# Patient Record
Sex: Male | Born: 1965
Health system: Southern US, Community
[De-identification: ages and names within clinical notes are randomized; demographics above are authoritative.]

## PROBLEM LIST (undated history)

## (undated) DIAGNOSIS — J309 Allergic rhinitis, unspecified: Principal | ICD-10-CM

## (undated) DIAGNOSIS — H1010 Acute atopic conjunctivitis, unspecified eye: Secondary | ICD-10-CM

## (undated) DIAGNOSIS — G2581 Restless legs syndrome: Secondary | ICD-10-CM

## (undated) DIAGNOSIS — F419 Anxiety disorder, unspecified: Secondary | ICD-10-CM

## (undated) HISTORY — PX: ADENOIDECTOMY: SUR15

## (undated) HISTORY — DX: Allergic rhinitis, unspecified: J30.9

## (undated) HISTORY — DX: Restless legs syndrome: G25.81

## (undated) HISTORY — PX: MEDIAL PARTIAL KNEE REPLACEMENT: SHX5965

## (undated) HISTORY — DX: Allergic rhinitis, unspecified: H10.10

## (undated) HISTORY — PX: TONSILLECTOMY: SUR1361

## (undated) HISTORY — DX: Anxiety disorder, unspecified: F41.9

---

## 2000-05-04 ENCOUNTER — Encounter: Payer: Self-pay | Admitting: Cardiology

## 2000-05-04 ENCOUNTER — Encounter: Admission: RE | Admit: 2000-05-04 | Discharge: 2000-05-04 | Payer: Self-pay | Admitting: Cardiology

## 2000-05-06 ENCOUNTER — Ambulatory Visit (HOSPITAL_COMMUNITY): Admission: RE | Admit: 2000-05-06 | Discharge: 2000-05-06 | Payer: Self-pay | Admitting: Cardiology

## 2000-11-09 ENCOUNTER — Ambulatory Visit: Admission: RE | Admit: 2000-11-09 | Discharge: 2000-11-09 | Payer: Self-pay | Admitting: Family Medicine

## 2000-12-09 ENCOUNTER — Ambulatory Visit (HOSPITAL_BASED_OUTPATIENT_CLINIC_OR_DEPARTMENT_OTHER): Admission: RE | Admit: 2000-12-09 | Discharge: 2000-12-09 | Payer: Self-pay | Admitting: Family Medicine

## 2001-01-21 ENCOUNTER — Ambulatory Visit (HOSPITAL_BASED_OUTPATIENT_CLINIC_OR_DEPARTMENT_OTHER): Admission: RE | Admit: 2001-01-21 | Discharge: 2001-01-21 | Payer: Self-pay | Admitting: Internal Medicine

## 2007-08-06 ENCOUNTER — Observation Stay (HOSPITAL_COMMUNITY): Admission: RE | Admit: 2007-08-06 | Discharge: 2007-08-07 | Payer: Self-pay | Admitting: Otolaryngology

## 2007-08-06 ENCOUNTER — Encounter (INDEPENDENT_AMBULATORY_CARE_PROVIDER_SITE_OTHER): Payer: Self-pay | Admitting: Otolaryngology

## 2010-03-20 ENCOUNTER — Encounter
Admission: RE | Admit: 2010-03-20 | Discharge: 2010-03-20 | Payer: Self-pay | Source: Home / Self Care | Attending: Family Medicine | Admitting: Family Medicine

## 2010-07-30 NOTE — Op Note (Signed)
[name], [name]               ACCOUNT NO.:  [id number]   MEDICAL RECORD NO.:  [id number]          PATIENT TYPE:  OBV   LOCATION:  3302                         FACILITY:  [facility]   PHYSICIAN:  Hermelinda Medicus, M.D.   DATE OF BIRTH:  1965-10-29   DATE OF PROCEDURE:  DATE OF DISCHARGE:                               OPERATIVE REPORT   POSTOPERATIVE DIAGNOSIS:  Sleep apnea with a lowest O2 of 85%, a  respiratory disturbance index of 34.5, and a rapid eye movement and deep  sleep of no time spent.   POSTOPERATIVE DIAGNOSIS:  Sleep apnea with a lowest O2 of 85%, a  respiratory disturbance index of 34.5, and a rapid eye movement and deep  sleep of no time spent.   OPERATION:  1. Septal reconstruction.  2. Turbinate reduction.  3. Tonsillectomy.  4. Uvulopalatoplasty.   ANESTHESIA:  General endotracheal anesthesia with Dr. Krista Blue.   SURGEON:  Hermelinda Medicus, MD   INDICATIONS FOR PROCEDURE:  The patient is aware of the risks and gains.  He is aware he could have some bleeding from his tonsillar beds and  needs to be very careful.  He should not travel long distances for at  least 10 days.  He is to be on a soft diet.  He has no travel into out-  of-the-way areas, and he is also to be on an antibiotic postoperatively  and is given pain medication to use judiciously.   PROCEDURE IN DETAIL:  The patient was placed in the supine position, and  under general endotracheal anesthesia, the nose was first approached,  where 1% Xylocaine with epinephrine and topical cocaine was used for the  anesthesia.  After prepping and draping in the usual manner, the septum  was approached.  Using a hemitransfixion incision at the ethmoid region,  this mucosal perichondrium and periosteum was elevated on the right  side, and a section of ethmoid septum was removed using the opened and  closed Jansen-Middleton.  Once this was completed, the septum on the  right side was in good status.  On the left side,  the vomerine septal  spur, we approached it in a posterior incision and removed this spur  that stood about a centimeter to slightly greater than that sticking  into the side of the nose posteriorly.  Once this was removed using a 4-  mm chisel and the open and closed Jansen-Middleton, the septum was in  good midline and closure was completed using 4-0 plain catgut through-  and-through septal suture.  The inferior turbinates were aggressively  outfractured, but no mucous membrane was removed, and then the Elmed  bipolar cautery was used to cauterize the lateral aspects of the  inferior turbinates.  The patient tolerated this very well and Telfa was  placed within his nose to minimize any bleeding or swelling.  The  patient was then repositioned.  The tonsillar gag was placed.  The  tonsils were removed using blunt and Bovie electrocoagulation  dissection.  All hemostasis was established.  This gave [place] considerable  more space in this very small shallow mouth.  Then, the palate was  trimmed using this dissection lateral to the uvula elevating the palatal  contour to approximately 7 mm, and the uvula, which was enlarged  primarily because of the persistent snoring, was trimmed down to its  normal size.  The anterior and posterior mucous membranes were then  approximated on the uvula using 5-0 plain catgut.  Once this was  achieved and all hemostasis was again checked, the stomach was  suctioned, the nasopharynx was suctioned, then the Telfa was removed,  and a 7 x 7-1/2 anesthesia trumpet were replaced within the nose to  guarantee his postoperative airway.  The patient was taken to the  recovery room in good condition with the plan of keeping him overnight  under observation on pulse oxymetry and then will be home on soft, bland  diet.   FOLLOWUP:  His followup will then be in 5 days, then 10 days, then 3  weeks, 6 weeks, 3 months, 6 months, and a year.            ______________________________  Hermelinda Medicus, M.D.     JC/MEDQ  D:  08/06/2007  T:  08/07/2007  Job:  [id number]   cc:   Molly Maduro A. Nicholos Johns, M.D.  Anna Genre Little, M.D.

## 2010-07-30 NOTE — H&P (Signed)
NAME:  [name], [name] NO.:  [id number]   MEDICAL RECORD NO.:  [id number]           PATIENT TYPE:   LOCATION:                                 FACILITY:   PHYSICIAN:  Hermelinda Medicus, M.D.        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  He is a snorer, but also he has sleep apnea.  He  had a sleep study completed where he had a respiratory disturbance index  of 34.6.  His lowest O2 was 85%, but his deep sleep and REM sleep was  absent, normal would be approximately 30%.  He is chronically fatigued.  He is falling asleep in front of TV or a book, and he just feels spent  and is on medication to try to help him get more rest.  Our plan,  because he has a very small nose, a septal deviation secondary to  history of trauma and a turbinate hypertrophy plus he has moderate-sized  tonsils and a very small mouth, is to do a septal reconstruction,  turbinate reduction, a tonsillectomy, and a uvulopalatoplasty to try to  gain some airway to improve his sleep pattern as well as improve the  sleep apnea issue.  He in 2002, had a cardiac cath, which was completely  clear by Dr. Julieanne Manson.  He has had no problem at any time since  that time and has had no chest pain and is an active individual except  for this chronic fatigue issue.  He has an allergy to DILAUDID.  His  medications are Cymbalta and ropinirole.  His respiratory status is  totally unremarkable.  He has had a chest x-ray in the distant past,  which was unremarkable.  He does not smoke, and he now enters for the  above-mentioned sleep surgery.  He has considered CPAP and has rejected  that.   PHYSICAL EXAMINATION:  VITAL SIGNS:  His physical examination reveals a  blood pressure of 127/80, his pulse is 78, temperature 97.8, and pulse  oximetry 98%.  His  lowest O2 when he has a polysomnogram was 85%.  He is  overweight.  His weight is 214.  His BMI is 29.85.  HEENT:  His larynx is clear.  True cords, false cords, epiglottis, base  of tongue are clear of any ulceration or mass.  True cord mobility, gag  reflex, tongue mobility, EOMs, and facial nerve were all symmetrical as  is shoulder strength.  His tonsils were moderately large in size taking  up considerable space in the small pharynx.  His septum is deviated to  the right in the ethmoid and then he has vomerine spur on the left  blocking the more posterior aspect of his nose.  He also has  considerable turbinate hypertrophy and has a fairly small narrow nose.  NECK:  His neck is free of any thyromegaly, cervical adenopathy, or  mass.  CHEST:  Clear.  No rales, rhonchi, or wheezes.  CARDIOVASCULAR:  Exam without rubs, murmurs, or gallops.  EXTREMITIES:  Unremarkable.   INITIAL DIAGNOSES:  Sleep apnea with respiratory distress index of 34.6,  lowest O2 85%, but no deep or rapid eye movement sleep was recorded,  history of cardiac cauterization with normal results, history of severe  sleepiness secondary to sleep deprivation.           ______________________________  Hermelinda Medicus, M.D.     JC/MEDQ  D:  08/06/2007  T:  08/06/2007  Job:  [id number]   cc:   Molly Maduro A. Nicholos Johns, M.D.  Thereasa Solo. Little, M.D.

## 2010-08-02 NOTE — Cardiovascular Report (Signed)
[place]. [facility]  Patient:    [name], [name]                      MRN: [id number] Proc. Date: 05/06/00 Adm. Date:  [id number] Attending:  Loreli Dollar CC:         Cath Lab  Meredith Staggers, M.D.   Cardiac Catheterization  INDICATIONS:  Mr. 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 He had a standard physical examination which included an exercise stress test. The stress test was positive showing inferior ST segment depression of 3 mm that persisted 4 minutes in the recovery phase. Because of this, he is brought in for cardiac catheterization. He has been asymptomatic except for a drop in his exercise tolerance.  PROCEDURES PERFORMED: 1. Left heart catheterization. 2. Selective right and left coronary arteriography. 3. Ventriculography in RAO projection.  COMPLICATIONS:  None.  EQUIPMENT: #6 [place] Judkins configuration catheters.  DESCRIPTION OF PROCEDURE:  The patient was prepped and draped in the usual sterile fashion exposing the right groin. Following local anesthetic of 1% Xylocaine, the Seldinger technique was employed and a #6 [place] introducer sheath was placed into the right femoral artery. Selective right and left coronary arteriography and ventriculography in the RAO projection was performed.  RESULTS:  HEMODYNAMIC MONITORING: 1. Central aortic pressure was 102/67. 2. Left ventricular pressure was 104/8 with no aortic valve gradient    noted at the time of pullback.  VENTRICULOGRAPHY:  Ventriculography in the RAO projection using 25 cc of contrast at 12 cc per second showed normal left ventricular systolic function, calculated at 63%. The end diastolic pressure was 16. There was mild mitral valve prolapse with no mitral regurgitation.  CORONARY ARTERIOGRAPHY:  There was no calcification noted on fluoroscopy. 1. Left main coronary artery:  Normal. 2. Left anterior descending  artery: The LAD extended down and across the apex    of the heart and gave rise to a first diagonal. This was relatively large    vessel with about 3.5 to 3.75 mm. This system was free of disease. 3. Optional diagonal:  Normal. 4. Circumflex coronary artery:  The circumflex gave rise to a single large    obtuse marginal vessel which was free of disease. 5. Right coronary artery:  The right coronary artery was a large dominant    vessel with a posterior descending artery and two posterolateral branches.    This system was free of disease.  CONCLUSIONS: 1. No evidence of coronary artery disease. 2. Normal left ventricular systolic function. 3. Mild mitral valve prolapse without mitral regurgitation.  COMMENTS:  At this point it appears that the stress test was a false positive study. I see nothing to suggest that there is any concerns that he has any coronary artery disease. DD:  05/06/00 TD:  05/06/00 Job: [id number]

## 2010-12-11 LAB — CBC
HCT: 50
Hemoglobin: 16.5
MCHC: 32.9
MCV: 77.5 — ABNORMAL LOW
Platelets: 185
RBC: 6.44 — ABNORMAL HIGH
RDW: 14.1
WBC: 5.8

## 2010-12-11 LAB — URINALYSIS, ROUTINE W REFLEX MICROSCOPIC
Bilirubin Urine: NEGATIVE
Glucose, UA: NEGATIVE
Hgb urine dipstick: NEGATIVE
Ketones, ur: NEGATIVE
Nitrite: NEGATIVE
Protein, ur: NEGATIVE
Specific Gravity, Urine: 1.009
Urobilinogen, UA: 0.2
pH: 6.5

## 2013-02-15 ENCOUNTER — Other Ambulatory Visit: Payer: Self-pay | Admitting: Otolaryngology

## 2013-02-15 DIAGNOSIS — H905 Unspecified sensorineural hearing loss: Secondary | ICD-10-CM

## 2013-02-15 DIAGNOSIS — H9312 Tinnitus, left ear: Secondary | ICD-10-CM

## 2013-02-23 ENCOUNTER — Ambulatory Visit
Admission: RE | Admit: 2013-02-23 | Discharge: 2013-02-23 | Disposition: A | Discharge status: Home / Self Care | Payer: 59 | Source: Ambulatory Visit | Attending: Otolaryngology | Admitting: Otolaryngology

## 2013-02-23 DIAGNOSIS — H9312 Tinnitus, left ear: Secondary | ICD-10-CM

## 2013-02-23 DIAGNOSIS — H905 Unspecified sensorineural hearing loss: Secondary | ICD-10-CM

## 2013-02-23 MED ORDER — GADOBENATE DIMEGLUMINE 529 MG/ML IV SOLN
17.0000 mL | Freq: Once | INTRAVENOUS | Status: AC | PRN
  Administered 2013-02-23: 17 mL via INTRAVENOUS

## 2014-01-03 ENCOUNTER — Other Ambulatory Visit: Payer: Self-pay | Admitting: Orthopedic Surgery

## 2014-01-03 DIAGNOSIS — M25562 Pain in left knee: Secondary | ICD-10-CM

## 2014-01-13 ENCOUNTER — Other Ambulatory Visit: Payer: 59

## 2014-01-18 ENCOUNTER — Ambulatory Visit
Admission: RE | Admit: 2014-01-18 | Discharge: 2014-01-18 | Disposition: A | Discharge status: Home / Self Care | Payer: 59 | Source: Ambulatory Visit | Attending: Orthopedic Surgery | Admitting: Orthopedic Surgery

## 2014-01-18 DIAGNOSIS — M25562 Pain in left knee: Secondary | ICD-10-CM

## 2014-01-18 MED ORDER — IOHEXOL 180 MG/ML  SOLN
30.0000 mL | Freq: Once | INTRAMUSCULAR | Status: AC | PRN
  Administered 2014-01-18: 30 mL via INTRA_ARTICULAR

## 2014-06-14 ENCOUNTER — Other Ambulatory Visit: Payer: Self-pay | Admitting: Orthopedic Surgery

## 2014-06-14 DIAGNOSIS — Z09 Encounter for follow-up examination after completed treatment for conditions other than malignant neoplasm: Secondary | ICD-10-CM

## 2014-06-18 ENCOUNTER — Other Ambulatory Visit: Payer: Self-pay

## 2014-06-19 ENCOUNTER — Other Ambulatory Visit: Payer: Self-pay

## 2014-06-21 ENCOUNTER — Ambulatory Visit
Admission: RE | Admit: 2014-06-21 | Discharge: 2014-06-21 | Disposition: A | Discharge status: Home / Self Care | Payer: 59 | Source: Ambulatory Visit | Attending: Orthopedic Surgery | Admitting: Orthopedic Surgery

## 2014-06-21 DIAGNOSIS — Z09 Encounter for follow-up examination after completed treatment for conditions other than malignant neoplasm: Secondary | ICD-10-CM

## 2016-06-12 DIAGNOSIS — L821 Other seborrheic keratosis: Secondary | ICD-10-CM | POA: Diagnosis not present

## 2016-06-12 DIAGNOSIS — L728 Other follicular cysts of the skin and subcutaneous tissue: Secondary | ICD-10-CM | POA: Diagnosis not present

## 2016-06-19 ENCOUNTER — Other Ambulatory Visit: Payer: Self-pay | Admitting: Nurse Practitioner

## 2016-06-19 ENCOUNTER — Ambulatory Visit
Admission: RE | Admit: 2016-06-19 | Discharge: 2016-06-19 | Disposition: A | Discharge status: Home / Self Care | Payer: 59 | Source: Ambulatory Visit | Attending: Nurse Practitioner | Admitting: Nurse Practitioner

## 2016-06-19 DIAGNOSIS — R053 Chronic cough: Secondary | ICD-10-CM

## 2016-06-19 DIAGNOSIS — R0989 Other specified symptoms and signs involving the circulatory and respiratory systems: Secondary | ICD-10-CM

## 2016-06-19 DIAGNOSIS — R05 Cough: Secondary | ICD-10-CM | POA: Diagnosis not present

## 2016-06-23 ENCOUNTER — Emergency Department (HOSPITAL_COMMUNITY)
Admission: EM | Admit: 2016-06-23 | Discharge: 2016-06-23 | Disposition: A | Discharge status: Home / Self Care | Payer: 59 | Attending: Emergency Medicine | Admitting: Emergency Medicine

## 2016-06-23 ENCOUNTER — Emergency Department (HOSPITAL_COMMUNITY): Payer: 59

## 2016-06-23 DIAGNOSIS — Z7982 Long term (current) use of aspirin: Secondary | ICD-10-CM | POA: Insufficient documentation

## 2016-06-23 DIAGNOSIS — J9811 Atelectasis: Secondary | ICD-10-CM | POA: Diagnosis not present

## 2016-06-23 DIAGNOSIS — R0789 Other chest pain: Secondary | ICD-10-CM | POA: Insufficient documentation

## 2016-06-23 DIAGNOSIS — R072 Precordial pain: Secondary | ICD-10-CM | POA: Diagnosis not present

## 2016-06-23 LAB — BASIC METABOLIC PANEL
ANION GAP: 12 (ref 5–15)
BUN: 17 mg/dL (ref 6–20)
CO2: 24 mmol/L (ref 22–32)
Calcium: 9.5 mg/dL (ref 8.9–10.3)
Chloride: 100 mmol/L — ABNORMAL LOW (ref 101–111)
Creatinine, Ser: 1.19 mg/dL (ref 0.61–1.24)
Glucose, Bld: 107 mg/dL — ABNORMAL HIGH (ref 65–99)
POTASSIUM: 3.9 mmol/L (ref 3.5–5.1)
SODIUM: 136 mmol/L (ref 135–145)

## 2016-06-23 LAB — CBC
HEMATOCRIT: 47.5 % (ref 39.0–52.0)
Hemoglobin: 15.8 g/dL (ref 13.0–17.0)
MCH: 25.6 pg — ABNORMAL LOW (ref 26.0–34.0)
MCHC: 33.3 g/dL (ref 30.0–36.0)
MCV: 77.1 fL — ABNORMAL LOW (ref 78.0–100.0)
Platelets: 165 10*3/uL (ref 150–400)
RBC: 6.16 MIL/uL — AB (ref 4.22–5.81)
RDW: 15 % (ref 11.5–15.5)
WBC: 6.7 10*3/uL (ref 4.0–10.5)

## 2016-06-23 LAB — TROPONIN I

## 2016-06-23 LAB — I-STAT TROPONIN, ED: Troponin i, poc: 0 ng/mL (ref 0.00–0.08)

## 2016-06-23 MED ORDER — ASPIRIN 81 MG PO CHEW
CHEWABLE_TABLET | ORAL | Status: AC
  Administered 2016-06-23: 324 mg
  Filled 2016-06-23: qty 4

## 2016-06-23 MED ORDER — KETOROLAC TROMETHAMINE 30 MG/ML IJ SOLN
15.0000 mg | Freq: Once | INTRAMUSCULAR | Status: DC
  Filled 2016-06-23: qty 1

## 2016-06-23 NOTE — ED Provider Notes (Signed)
[facility]ENCY DEPT Provider Note   CSN: [id number] Arrival date & time: 06/23/16  1504     History   Chief Complaint Chief Complaint  Patient presents with  . Chest Pain    HPI [name] is a 51 y.o. male.  HPI  Patient presents with concern of chest pain. He notes over the past few days he has had pain just to the left of the sternum. No pleurisy, no exertional, no positional changes. Pain is sore, moderate. Today the patient developed pain in the sternum itself, without radiation, otherwise characteristically similar to his earlier pain. No associated dyspnea, fever, chills, nausea, vomiting, cough. Patient is generally well, smokes cigars occasionally, drinks socially, has no known cardiac history.   No past medical history on file.  There are no active problems to display for this patient.   No past surgical history on file.     Home Medications    Prior to Admission medications   Not on File    Family History No family history on file.  Social History Social History  Substance Use Topics  . Smoking status: Not on file  . Smokeless tobacco: Not on file  . Alcohol use Not on file     Allergies   Patient has no known allergies.   Review of Systems Review of Systems  Constitutional:       Per HPI, otherwise negative  HENT:       Per HPI, otherwise negative  Respiratory:       Per HPI, otherwise negative  Cardiovascular:       Per HPI, otherwise negative  Gastrointestinal: Negative for vomiting.  Endocrine:       Negative aside from HPI  Genitourinary:       Neg aside from HPI   Musculoskeletal:       Per HPI, otherwise negative  Skin: Negative.   Neurological: Negative for syncope.     Physical Exam Updated Vital Signs BP (!) 156/92 (BP Location: Right Arm)   Pulse 87   Temp 99 F (37.2 C)   SpO2 99%   Physical Exam  Constitutional: He is oriented to person, place, and time. He appears well-developed. No distress.    HENT:  Head: Normocephalic and atraumatic.  Eyes: Conjunctivae and EOM are normal.  Cardiovascular: Normal rate and regular rhythm.   Pulmonary/Chest: Effort normal. No stridor. No respiratory distress. He exhibits no tenderness.  Abdominal: He exhibits no distension.  Musculoskeletal: He exhibits no edema.  Neurological: He is alert and oriented to person, place, and time.  Skin: Skin is warm and dry.  Psychiatric: He has a normal mood and affect.  Nursing note and vitals reviewed.    ED Treatments / Results  Labs (all labs ordered are listed, but only abnormal results are displayed) Labs Reviewed  BASIC METABOLIC PANEL - Abnormal; Notable for the following:       Result Value   Chloride 100 (*)    Glucose, Bld 107 (*)    All other components within normal limits  CBC - Abnormal; Notable for the following:    RBC 6.16 (*)    MCV 77.1 (*)    MCH 25.6 (*)    All other components within normal limits  TROPONIN I  I-STAT TROPOININ, ED    EKG  EKG Interpretation  Date/Time:  Monday June 23 2016 15:42:31 EDT Ventricular Rate:  88 PR Interval:    QRS Duration: 85 QT Interval:  370 QTC Calculation: 448  R Axis:   86 Text Interpretation:  Sinus rhythm Baseline wander in lead(s) V1 T wave abnormality Borderline ECG Confirmed by Gerhard Munch  MD [phone]) on 06/23/2016 3:59:00 PM       Radiology Dg Chest 2 View  Result Date: 06/23/2016 CLINICAL DATA:  Chest tightness for 2 weeks EXAM: CHEST  2 VIEW COMPARISON:  06/19/2016 FINDINGS: Cardiac shadow is within normal limits. The lungs are well aerated bilaterally. No focal infiltrate the is noted. Minimal left basilar atelectasis is seen projecting in the lingula. No acute bony abnormality is noted. IMPRESSION: Minimal left basilar atelectasis. Electronically Signed   By: Alcide Clever M.D.   On: 06/23/2016 16:20    Procedures Procedures (including critical care time)  Medications Ordered in ED Medications  ketorolac  (TORADOL) 30 MG/ML injection 15 mg (not administered)  aspirin 81 MG chewable tablet (324 mg  Given 06/23/16 1547)   4:43 PM Pain resolved.  Patient and family aware of initial findings.  6:56 PM Patient awake and alert in no distress I discussed all findings with him, his wife, and a work Animator. Specifically we discussed reassuring EKG, x-ray, serial negative troponin. I reviewed the x-ray, and there arechanges from prior, and he specifically denies any cough, fever, low suspicion for occult, atypical pneumonia. With no ongoing exertional, pleuritic pain, no chest pain currently, no evidence for infection, no evidence for pulmonary embolism, low risk profile, the patient is appropriate for further evaluation, management as an outpatient. Initial Impression / Assessment and Plan / ED Course  I have reviewed the triage vital signs and the nursing notes.  Pertinent labs & imaging results that were available during my care of the patient were reviewed by me and considered in my medical decision making (see chart for details).    Final Clinical Impressions(s) / ED Diagnoses  Atypical chest pain   Gerhard Munch, MD 06/23/16 1857

## 2016-06-23 NOTE — Discharge Instructions (Signed)
As discussed, your evaluation today has been largely reassuring.  But, it is important that you monitor your condition carefully, and do not hesitate to return to the ED if you develop new, or concerning changes in your condition. ? ?Otherwise, please follow-up with your physician for appropriate ongoing care. ? ?

## 2016-06-23 NOTE — ED Notes (Signed)
Ambulatory to restroom without any complains. Denies cp or sob. Skin w/d.

## 2016-06-23 NOTE — ED Notes (Signed)
Transported to xray 

## 2016-06-23 NOTE — ED Triage Notes (Signed)
Chest tightness for the past couple of weeks. Worse today. Pt was at work prior to coming to ED. Ambulatory into ED without difficulty. Pt states walking doesn't make pain worse or better. Describes pain as tightness 5/10 center of chest at present. No diaphoresis, no n/v

## 2016-06-23 NOTE — ED Notes (Signed)
Pt declines needing Toradol at this time.

## 2016-07-09 ENCOUNTER — Ambulatory Visit: Payer: 59 | Admitting: Physician Assistant

## 2016-07-23 DIAGNOSIS — K219 Gastro-esophageal reflux disease without esophagitis: Secondary | ICD-10-CM | POA: Diagnosis not present

## 2016-07-23 DIAGNOSIS — Z0000 Encounter for general adult medical examination without abnormal findings: Secondary | ICD-10-CM | POA: Diagnosis not present

## 2016-07-23 DIAGNOSIS — G2581 Restless legs syndrome: Secondary | ICD-10-CM | POA: Diagnosis not present

## 2016-09-03 DIAGNOSIS — H16202 Unspecified keratoconjunctivitis, left eye: Secondary | ICD-10-CM | POA: Diagnosis not present

## 2016-12-29 DIAGNOSIS — H10413 Chronic giant papillary conjunctivitis, bilateral: Secondary | ICD-10-CM | POA: Diagnosis not present

## 2017-01-05 DIAGNOSIS — H1045 Other chronic allergic conjunctivitis: Secondary | ICD-10-CM | POA: Diagnosis not present

## 2017-02-25 DIAGNOSIS — H6061 Unspecified chronic otitis externa, right ear: Secondary | ICD-10-CM | POA: Diagnosis not present

## 2017-04-06 ENCOUNTER — Ambulatory Visit: Payer: 59 | Admitting: Allergy

## 2017-04-06 ENCOUNTER — Encounter: Payer: Self-pay | Admitting: Allergy

## 2017-04-06 VITALS — BP 132/88 | HR 80 | Temp 98.5°F | Resp 20 | Ht 71.14 in | Wt 210.0 lb

## 2017-04-06 DIAGNOSIS — J309 Allergic rhinitis, unspecified: Secondary | ICD-10-CM | POA: Diagnosis not present

## 2017-04-06 DIAGNOSIS — H1010 Acute atopic conjunctivitis, unspecified eye: Secondary | ICD-10-CM | POA: Diagnosis not present

## 2017-04-06 NOTE — Progress Notes (Signed)
New Patient Note  RE: Brian QuarryMichael Laforest MRN: [id number] DOB: Dec 02, 1965 Date of Office Visit: 04/06/2017  Referring provider: No ref. provider found Primary care provider: Elias Elseeade, Robert, MD  Chief Complaint: allergies  History of present illness: [name] is a 52 y.o. male presenting today for evaluation of allergy symptoms.      He reports as he has aged he has been having more issues with seasonal allergies.  He is also concerned he is allergic to his dog that he got in May 2018.  After getting the dog he started having red, irritated itchy eyes also with drainage.   He went to his eye doctor who recommended eye drop (tobramycin for 2 weeks).  The tobramycin cleared up his eyes and resolved the drainage and he stopped use.   The symptoms returned including the drainage and he started using tobramycin again.  He states he is currently using this eye drop.  He also tried olopatadine however this did not help his eye symptoms.        He also reports nasal congestion and drainage and sneezing symptoms that can occur all year round.  He does reports cutting grass leads to allergy symptoms.      He has no history of asthma, eczema or food allergy.    Review of systems: Review of Systems  Constitutional: Negative for chills, fever and malaise/fatigue.  HENT: Positive for congestion. Negative for ear discharge, ear pain, nosebleeds, sinus pain, sore throat and tinnitus.   Eyes: Positive for discharge and redness. Negative for blurred vision, photophobia and pain.  Respiratory: Negative for cough, shortness of breath and wheezing.   Cardiovascular: Negative for chest pain.  Gastrointestinal: Negative for abdominal pain, constipation, diarrhea, heartburn, nausea and vomiting.  Musculoskeletal: Negative for joint pain and myalgias.  Skin: Negative for itching and rash.  Neurological: Negative for headaches.    All other systems negative unless noted above in HPI  Past medical  history: Past Medical History:  Diagnosis Date  . Anxiety   . Restless leg     Past surgical history: Past Surgical History:  Procedure Laterality Date  . ADENOIDECTOMY    . TONSILLECTOMY     Family history:  Family History  Problem Relation Age of Onset  . Lung disease Father   . Alcoholism Paternal Grandfather     Social history: He lives in a home without carpeting placed 1 yr ago. Home has electric heating and central cooling.  Dogs in the home.  No concern for water damage, mildew or roaches in the home.  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  He has a history of cigar use.    Medication List: Allergies as of 04/06/2017   No Known Allergies     Medication List        Accurate as of 04/06/17 10:55 AM. Always use your most recent med list.          escitalopram 10 MG tablet Commonly known as:  LEXAPRO Take 10 mg by mouth daily.   rOPINIRole 1 MG tablet Commonly known as:  REQUIP Take 1 mg by mouth at bedtime.   tobramycin 0.3 % ophthalmic solution Commonly known as:  TOBREX Place 1 drop into both eyes 2 (two) times daily.       Known medication allergies: No Known Allergies   Physical examination: Blood pressure 132/88, pulse 80, temperature 98.5 F (36.9 C), temperature source Oral, resp. rate 20, height 5' 11.14" (1.807 m),  weight 210 lb (95.3 kg).  General: Alert, interactive, in no acute distress. HEENT: PERRLA, TMs pearly gray, turbinates mildly edematous without discharge, post-pharynx non erythematous. Neck: Supple without lymphadenopathy. Lungs: Clear to auscultation without wheezing, rhonchi or rales. {no increased work of breathing. CV: Normal S1, S2 without murmurs. Abdomen: Nondistended, nontender. Skin: Warm and dry, without lesions or rashes. Extremities:  No clubbing, cyanosis or edema. Neuro:   Grossly intact.  Diagnositics/Labs: Allergy testing: environmental skin prick testing is positive to oak tree.  Intradermal testing is  positive to dog, mold mix 4 Allergy testing results were read and interpreted by provider, documented by clinical staff.   Assessment and plan:   Allergic rhinoconjunctivitis  - environmental allergy skin testing today is positive for oak tree, molds and dog.    - allergen avoidance measures discusses and handout provided  - for itchy/watery/red eyes try Ketotifen (Alaway or Zaditor OTC) 1 drop each eye up to twice a day as needed  - for nasal congestion/drainage recommend use of nasal steroid spray like OTC Flonase, Rhinocort or Nasacort 2 sprays each nostril daily as needed.  Use for 1-2 weeks at a time before stopping once symptoms improve  - a daily long-acting antihistamine like Xyzal 5mg , Allegra 180mg  or Zyrtec 10mg  as needed can also be used to help manage allergy symptoms    - allergen immunotherapy (allergy shots) discussed including protocol, benefits/risks.    Follow-up 4-6 months or sooner if needed  I appreciate the opportunity to take part in Michael's care. Please do not hesitate to contact me with questions.  Sincerely,   Margo Aye, MD Allergy/Immunology [facility]

## 2017-04-06 NOTE — Patient Instructions (Addendum)
Allergic rhinoconjunctivitis  - environmental allergy skin testing today is positive for oak tree, molds and dog.    - allergen avoidance measures discusses and handout provided  - for itchy/watery/red eyes try Ketotifen (Alaway or Zaditor OTC) 1 drop each eye up to twice a day as needed  - for nasal congestion/drainage recommend use of nasal steroid spray like OTC Flonase, Rhinocort or Nasacort 2 sprays each nostril daily as needed.  Use for 1-2 weeks at a time before stopping once symptoms improve  - a daily long-acting antihistamine like Xyzal 5mg , Allegra 180mg  or Zyrtec 10mg  as needed can also be used to help manage allergy symptoms    - allergen immunotherapy (allergy shots) discussed including protocol, benefits/risks.    Follow-up 4-6 months or sooner if needed

## 2017-04-22 ENCOUNTER — Telehealth: Payer: Self-pay | Admitting: Allergy

## 2017-04-22 NOTE — Telephone Encounter (Signed)
[name] called in and had some questions he would like answered by Dr. Delorse LekPadgett.  1. [name] would like to know since his dog allergy did not show up on the first test but it did show up on the second test, does that mean it is a minor allergy to dogs?  2. [name] was around his dog at 6am this morning and stated that his symptoms are getting worse through out the day and is wondering why are they getting worse instead os subsiding since he is no longer around his dog?  Please advise.

## 2017-04-23 NOTE — Telephone Encounter (Signed)
I spoke with [name], he states that his dog is still inside his home but that he limits him to the first floor and his bedroom is on the second.  I went through avoidance measures regarding the dog and discussed possibly getting a HEPA filtration device specified to help with pet dander.  He asked about the Xyzal and how long it should take to start working, I told him it shouldn't take too long. I mentioned trying other antihistamines as mentioned in your note if he doesn't feel like the Xyzal is affective.  He felt better after our conversation and I told him to call if he had any further questions.

## 2017-04-23 NOTE — Telephone Encounter (Signed)
Thanks so much. 

## 2017-04-23 NOTE — Telephone Encounter (Signed)
No it does not mean his sensitivity to dog is minor.   Being sensitive (or allergic) to dogs does not mean he will react with every dog exposure but has the potential to.  It may be that he is not allergic to his dog but may have symptoms around a new dog or neighbors dog etc.   If it reacting to his dog and he had exposure to the dog that would make sense to have symptoms after the exposure throughout the day especially if the dog is not in the home anymore.  Is this the case? Or is he stills symptomatic without dog exposure as this could be the case since he is sensitive to other allergens as well.

## 2017-04-30 DIAGNOSIS — H1013 Acute atopic conjunctivitis, bilateral: Secondary | ICD-10-CM | POA: Diagnosis not present

## 2017-05-12 DIAGNOSIS — H1013 Acute atopic conjunctivitis, bilateral: Secondary | ICD-10-CM | POA: Diagnosis not present

## 2017-05-21 NOTE — Progress Notes (Signed)
VIALS EXP 05-22-18 

## 2017-05-21 NOTE — Addendum Note (Signed)
Addended by: Lorrin MaisPADGETT, Ferrel Simington P on: 05/21/2017 08:38 AM   Modules accepted: Orders

## 2017-05-21 NOTE — Addendum Note (Signed)
Addended by: Lorrin MaisPADGETT, SHAYLAR P on: 05/21/2017 08:35 AM   Modules accepted: Orders

## 2017-05-22 DIAGNOSIS — J3089 Other allergic rhinitis: Secondary | ICD-10-CM | POA: Diagnosis not present

## 2017-05-25 DIAGNOSIS — J301 Allergic rhinitis due to pollen: Secondary | ICD-10-CM | POA: Diagnosis not present

## 2017-05-28 ENCOUNTER — Ambulatory Visit (INDEPENDENT_AMBULATORY_CARE_PROVIDER_SITE_OTHER): Payer: 59 | Admitting: *Deleted

## 2017-05-28 DIAGNOSIS — H1013 Acute atopic conjunctivitis, bilateral: Secondary | ICD-10-CM | POA: Diagnosis not present

## 2017-05-28 DIAGNOSIS — J309 Allergic rhinitis, unspecified: Secondary | ICD-10-CM

## 2017-05-28 MED ORDER — EPINEPHRINE 0.3 MG/0.3ML IJ SOAJ: 0.3000 mg | Freq: Once | INTRAMUSCULAR | 1 refills | Status: AC

## 2017-05-28 NOTE — Progress Notes (Signed)
Patient started allergy injections Blue 1/100,000 at 0.05 dosage each 1-Mold and 1-Pollen-Pet. Reviewed side effects, injection schedule B 1-2 times weekly. epipen sent to [facility] and patient aware how/when to use.  Per Dr Delorse LekPadgett as soon as paperwork signed patient make take vials to [facility] nurse for administration. Paperwork given

## 2017-06-01 ENCOUNTER — Ambulatory Visit (INDEPENDENT_AMBULATORY_CARE_PROVIDER_SITE_OTHER): Payer: 59 | Admitting: *Deleted

## 2017-06-01 DIAGNOSIS — J309 Allergic rhinitis, unspecified: Secondary | ICD-10-CM | POA: Diagnosis not present

## 2017-06-04 ENCOUNTER — Ambulatory Visit: Payer: 59

## 2017-06-04 ENCOUNTER — Ambulatory Visit (INDEPENDENT_AMBULATORY_CARE_PROVIDER_SITE_OTHER): Payer: 59 | Admitting: *Deleted

## 2017-06-04 DIAGNOSIS — J309 Allergic rhinitis, unspecified: Secondary | ICD-10-CM | POA: Diagnosis not present

## 2017-06-08 ENCOUNTER — Ambulatory Visit (INDEPENDENT_AMBULATORY_CARE_PROVIDER_SITE_OTHER): Payer: 59 | Admitting: *Deleted

## 2017-06-08 DIAGNOSIS — G4700 Insomnia, unspecified: Secondary | ICD-10-CM | POA: Diagnosis not present

## 2017-06-08 DIAGNOSIS — J309 Allergic rhinitis, unspecified: Secondary | ICD-10-CM | POA: Diagnosis not present

## 2017-06-08 DIAGNOSIS — E785 Hyperlipidemia, unspecified: Secondary | ICD-10-CM | POA: Diagnosis not present

## 2017-06-12 ENCOUNTER — Ambulatory Visit (INDEPENDENT_AMBULATORY_CARE_PROVIDER_SITE_OTHER): Payer: 59 | Admitting: *Deleted

## 2017-06-12 DIAGNOSIS — J309 Allergic rhinitis, unspecified: Secondary | ICD-10-CM | POA: Diagnosis not present

## 2017-06-15 ENCOUNTER — Ambulatory Visit (INDEPENDENT_AMBULATORY_CARE_PROVIDER_SITE_OTHER): Payer: 59 | Admitting: *Deleted

## 2017-06-15 DIAGNOSIS — J309 Allergic rhinitis, unspecified: Secondary | ICD-10-CM

## 2017-06-18 ENCOUNTER — Ambulatory Visit (INDEPENDENT_AMBULATORY_CARE_PROVIDER_SITE_OTHER): Payer: 59 | Admitting: *Deleted

## 2017-06-18 DIAGNOSIS — J309 Allergic rhinitis, unspecified: Secondary | ICD-10-CM | POA: Diagnosis not present

## 2017-06-22 ENCOUNTER — Ambulatory Visit (INDEPENDENT_AMBULATORY_CARE_PROVIDER_SITE_OTHER): Payer: 59 | Admitting: *Deleted

## 2017-06-22 DIAGNOSIS — J309 Allergic rhinitis, unspecified: Secondary | ICD-10-CM

## 2017-06-25 ENCOUNTER — Ambulatory Visit (INDEPENDENT_AMBULATORY_CARE_PROVIDER_SITE_OTHER): Payer: 59 | Admitting: *Deleted

## 2017-06-25 DIAGNOSIS — J309 Allergic rhinitis, unspecified: Secondary | ICD-10-CM | POA: Diagnosis not present

## 2017-06-29 ENCOUNTER — Ambulatory Visit (INDEPENDENT_AMBULATORY_CARE_PROVIDER_SITE_OTHER): Payer: 59 | Admitting: *Deleted

## 2017-06-29 DIAGNOSIS — J309 Allergic rhinitis, unspecified: Secondary | ICD-10-CM

## 2017-07-02 ENCOUNTER — Ambulatory Visit (INDEPENDENT_AMBULATORY_CARE_PROVIDER_SITE_OTHER): Payer: 59 | Admitting: *Deleted

## 2017-07-02 DIAGNOSIS — J309 Allergic rhinitis, unspecified: Secondary | ICD-10-CM | POA: Diagnosis not present

## 2017-07-06 ENCOUNTER — Ambulatory Visit (INDEPENDENT_AMBULATORY_CARE_PROVIDER_SITE_OTHER): Payer: 59 | Admitting: *Deleted

## 2017-07-06 DIAGNOSIS — J309 Allergic rhinitis, unspecified: Secondary | ICD-10-CM | POA: Diagnosis not present

## 2017-07-13 ENCOUNTER — Ambulatory Visit (INDEPENDENT_AMBULATORY_CARE_PROVIDER_SITE_OTHER): Payer: 59 | Admitting: *Deleted

## 2017-07-13 DIAGNOSIS — J309 Allergic rhinitis, unspecified: Secondary | ICD-10-CM | POA: Diagnosis not present

## 2017-07-16 ENCOUNTER — Ambulatory Visit (INDEPENDENT_AMBULATORY_CARE_PROVIDER_SITE_OTHER): Payer: 59 | Admitting: *Deleted

## 2017-07-16 DIAGNOSIS — J309 Allergic rhinitis, unspecified: Secondary | ICD-10-CM | POA: Diagnosis not present

## 2017-07-20 ENCOUNTER — Ambulatory Visit (INDEPENDENT_AMBULATORY_CARE_PROVIDER_SITE_OTHER): Payer: 59 | Admitting: *Deleted

## 2017-07-20 DIAGNOSIS — J309 Allergic rhinitis, unspecified: Secondary | ICD-10-CM | POA: Diagnosis not present

## 2017-07-23 ENCOUNTER — Ambulatory Visit (INDEPENDENT_AMBULATORY_CARE_PROVIDER_SITE_OTHER): Payer: 59 | Admitting: *Deleted

## 2017-07-23 DIAGNOSIS — J309 Allergic rhinitis, unspecified: Secondary | ICD-10-CM | POA: Diagnosis not present

## 2017-08-04 ENCOUNTER — Ambulatory Visit: Payer: 59 | Admitting: Allergy

## 2017-08-13 ENCOUNTER — Ambulatory Visit (INDEPENDENT_AMBULATORY_CARE_PROVIDER_SITE_OTHER): Payer: 59 | Admitting: *Deleted

## 2017-08-13 DIAGNOSIS — J309 Allergic rhinitis, unspecified: Secondary | ICD-10-CM | POA: Diagnosis not present

## 2017-08-17 ENCOUNTER — Ambulatory Visit (INDEPENDENT_AMBULATORY_CARE_PROVIDER_SITE_OTHER): Payer: 59 | Admitting: *Deleted

## 2017-08-17 DIAGNOSIS — J309 Allergic rhinitis, unspecified: Secondary | ICD-10-CM

## 2017-08-18 ENCOUNTER — Encounter: Payer: Self-pay | Admitting: Allergy

## 2017-08-18 ENCOUNTER — Telehealth: Payer: Self-pay | Admitting: Allergy

## 2017-08-18 ENCOUNTER — Ambulatory Visit (INDEPENDENT_AMBULATORY_CARE_PROVIDER_SITE_OTHER): Payer: 59 | Admitting: Allergy

## 2017-08-18 VITALS — BP 122/80 | HR 80 | Resp 16

## 2017-08-18 DIAGNOSIS — J309 Allergic rhinitis, unspecified: Secondary | ICD-10-CM

## 2017-08-18 DIAGNOSIS — H1010 Acute atopic conjunctivitis, unspecified eye: Secondary | ICD-10-CM

## 2017-08-18 NOTE — Telephone Encounter (Signed)
Received fax and will inform Mr. [name] it is ready.  Thank you!

## 2017-08-18 NOTE — Telephone Encounter (Signed)
[name] would like an itemized statement showing all of his payments and what he was paying for on everything this year up to date.  He would like the results faxed to [place] so he can pick them up today at his visit or when he comes in next for an injection.

## 2017-08-18 NOTE — Progress Notes (Signed)
Follow-up Note  RE: [name] MRN: [id number] DOB: June 18, 1965 Date of Office Visit: 08/18/2017   History of present illness: [name] is a 52 y.o. male presenting today for follow-up of allergic rhionconjunctivitis.  He was last seen in the office on 04/06/17 by myself.  Since this visit he started on allergen immunotherapy and has been progressing well through build-up phase without any large local or systemic reactions.   He still states he is having issues with red and itchy eyes.  He tried use of OTC ketotifen however he does not feel they were that helpful.  He has in the past tried olopatadine which he states he did not tolerate.   He does wear contacts but takes and replaces them daily.   He does feel the zyrtec and flonase has been overall helpful in controlling nasal and systemic allergy symptoms.   He states he did spend about 2 weeks in [place] this spring and he did not notice any reduction in allergy symptoms.   He denies any major health changes, surgeries or hospitalizations since last visit.    Review of systems: Review of Systems  Constitutional: Negative for chills, fever and malaise/fatigue.  HENT: Negative for congestion, ear discharge, nosebleeds, sinus pain and sore throat.   Eyes: Positive for redness. Negative for pain and discharge.  Respiratory: Negative for cough, shortness of breath and wheezing.   Cardiovascular: Negative for chest pain.  Gastrointestinal: Negative for abdominal pain, diarrhea, nausea and vomiting.  Musculoskeletal: Negative for joint pain.  Skin: Negative for itching and rash.  Neurological: Negative for headaches.    All other systems negative unless noted above in HPI  Past medical/social/surgical/family history have been reviewed and are unchanged unless specifically indicated below.  No changes  Medication List: Allergies as of 08/18/2017   No Known Allergies     Medication List        Accurate as of  08/18/17  5:06 PM. Always use your most recent med list.          cetirizine 10 MG tablet Commonly known as:  ZYRTEC Take 10 mg by mouth daily.   escitalopram 10 MG tablet Commonly known as:  LEXAPRO Take 10 mg by mouth daily.   fluticasone 50 MCG/ACT nasal spray Commonly known as:  FLONASE Place 2 sprays into both nostrils daily.   rOPINIRole 1 MG tablet Commonly known as:  REQUIP Take 1 mg by mouth at bedtime.       Known medication allergies: No Known Allergies   Physical examination: Blood pressure 122/80, pulse 80, resp. rate 16.  General: Alert, interactive, in no acute distress. HEENT: PERRLA, TMs pearly gray, turbinates minimally edematous without discharge, post-pharynx non erythematous. Neck: Supple without lymphadenopathy. Lungs: Clear to auscultation without wheezing, rhonchi or rales. {no increased work of breathing. CV: Normal S1, S2 without murmurs. Abdomen: Nondistended, nontender. Skin: Warm and dry, without lesions or rashes. Extremities:  No clubbing, cyanosis or edema. Neuro:   Grossly intact.  Diagnositics/Labs: None today  Assessment and plan:   Allergic rhinoconjunctivitis  - continue avoidance measures for for oak tree, molds and dog.    - for itchy/watery/red eyes try eye drop as follows: Naphazoline 0.25% and pheniramine 0.3% (Naphcon-A) or Naphazoline 0.27% and pheniramine 0.325% (Opcon-A) OTC options. Use either 1-2 drops per eye up to 3-4 times a day.  Let us know if can not find OTC and will provide with prescription.    - for nasal congestion/drainage recommend  use of nasal steroid spray like OTC Flonase, Rhinocort or Nasacort 2 sprays each nostril daily as needed.  Use for 1-2 weeks at a time before stopping once symptoms improve  - a daily long-acting antihistamine like Xyzal 5mg , Allegra 180mg  or Zyrtec 10mg  as needed can also be used to help manage allergy symptoms    - continue allergen immunotherapy (allergy shots) per protocol.   You are building up nicely and almost to maintenance dosing.    Follow-up 6 months or sooner if needed  I appreciate the opportunity to take part in [name]'s care. Please do not hesitate to contact me with questions.  Sincerely,   Margo AyeShaylar Cedra Villalon, MD Allergy/Immunology [facility]

## 2017-08-18 NOTE — Patient Instructions (Addendum)
Allergic rhinoconjunctivitis  - continue avoidance measures for for oak tree, molds and dog.    - for itchy/watery/red eyes try eye drop as follows: Naphazoline 0.25% and pheniramine 0.3% (Naphcon-A) or Naphazoline 0.27% and pheniramine 0.325% (Opcon-A) OTC options. Use either 1-2 drops per eye up to 3-4 times a day.  Let us know if can not find OTC and will provide with prescription.    - for nasal congestion/drainage recommend use of nasal steroid spray like OTC Flonase, Rhinocort or Nasacort 2 sprays each nostril daily as needed.  Use for 1-2 weeks at a time before stopping once symptoms improve  - a daily long-acting antihistamine like Xyzal 5mg , Allegra 180mg  or Zyrtec 10mg  as needed can also be used to help manage allergy symptoms    - continue allergen immunotherapy (allergy shots) per protocol.  You are building up nicely and almost to maintenance dosing.    Follow-up 6 months or sooner if needed

## 2017-08-18 NOTE — Telephone Encounter (Signed)
Faxed printout to [facility] [place] office - kt

## 2017-08-20 ENCOUNTER — Ambulatory Visit (INDEPENDENT_AMBULATORY_CARE_PROVIDER_SITE_OTHER): Payer: 59 | Admitting: *Deleted

## 2017-08-20 DIAGNOSIS — J309 Allergic rhinitis, unspecified: Secondary | ICD-10-CM | POA: Diagnosis not present

## 2017-08-24 ENCOUNTER — Ambulatory Visit (INDEPENDENT_AMBULATORY_CARE_PROVIDER_SITE_OTHER): Payer: 59 | Admitting: *Deleted

## 2017-08-24 DIAGNOSIS — J309 Allergic rhinitis, unspecified: Secondary | ICD-10-CM

## 2017-08-31 ENCOUNTER — Ambulatory Visit (INDEPENDENT_AMBULATORY_CARE_PROVIDER_SITE_OTHER): Payer: 59 | Admitting: *Deleted

## 2017-08-31 DIAGNOSIS — J309 Allergic rhinitis, unspecified: Secondary | ICD-10-CM | POA: Diagnosis not present

## 2017-09-07 ENCOUNTER — Ambulatory Visit (INDEPENDENT_AMBULATORY_CARE_PROVIDER_SITE_OTHER): Payer: 59 | Admitting: *Deleted

## 2017-09-07 DIAGNOSIS — J309 Allergic rhinitis, unspecified: Secondary | ICD-10-CM

## 2017-09-08 DIAGNOSIS — Z23 Encounter for immunization: Secondary | ICD-10-CM | POA: Diagnosis not present

## 2017-09-08 DIAGNOSIS — Z13220 Encounter for screening for lipoid disorders: Secondary | ICD-10-CM | POA: Diagnosis not present

## 2017-09-08 DIAGNOSIS — Z0000 Encounter for general adult medical examination without abnormal findings: Secondary | ICD-10-CM | POA: Diagnosis not present

## 2017-09-08 DIAGNOSIS — E785 Hyperlipidemia, unspecified: Secondary | ICD-10-CM | POA: Diagnosis not present

## 2017-09-21 ENCOUNTER — Ambulatory Visit (INDEPENDENT_AMBULATORY_CARE_PROVIDER_SITE_OTHER): Payer: 59 | Admitting: *Deleted

## 2017-09-21 DIAGNOSIS — J309 Allergic rhinitis, unspecified: Secondary | ICD-10-CM

## 2017-09-28 ENCOUNTER — Ambulatory Visit (INDEPENDENT_AMBULATORY_CARE_PROVIDER_SITE_OTHER): Payer: 59 | Admitting: *Deleted

## 2017-09-28 DIAGNOSIS — J309 Allergic rhinitis, unspecified: Secondary | ICD-10-CM | POA: Diagnosis not present

## 2017-10-05 ENCOUNTER — Ambulatory Visit (INDEPENDENT_AMBULATORY_CARE_PROVIDER_SITE_OTHER): Payer: 59 | Admitting: *Deleted

## 2017-10-05 DIAGNOSIS — J309 Allergic rhinitis, unspecified: Secondary | ICD-10-CM | POA: Diagnosis not present

## 2017-10-05 DIAGNOSIS — M170 Bilateral primary osteoarthritis of knee: Secondary | ICD-10-CM | POA: Diagnosis not present

## 2017-10-05 DIAGNOSIS — Z96652 Presence of left artificial knee joint: Secondary | ICD-10-CM | POA: Diagnosis not present

## 2017-10-05 DIAGNOSIS — G8929 Other chronic pain: Secondary | ICD-10-CM | POA: Diagnosis not present

## 2017-10-05 DIAGNOSIS — M25561 Pain in right knee: Secondary | ICD-10-CM | POA: Diagnosis not present

## 2017-10-08 ENCOUNTER — Other Ambulatory Visit: Payer: Self-pay | Admitting: Orthopedic Surgery

## 2017-10-08 DIAGNOSIS — M25561 Pain in right knee: Secondary | ICD-10-CM

## 2017-10-08 DIAGNOSIS — R609 Edema, unspecified: Secondary | ICD-10-CM

## 2017-10-09 ENCOUNTER — Ambulatory Visit
Admission: RE | Admit: 2017-10-09 | Discharge: 2017-10-09 | Disposition: A | Discharge status: Home / Self Care | Payer: 59 | Source: Ambulatory Visit | Attending: Orthopedic Surgery | Admitting: Orthopedic Surgery

## 2017-10-09 DIAGNOSIS — R609 Edema, unspecified: Secondary | ICD-10-CM

## 2017-10-09 DIAGNOSIS — M25561 Pain in right knee: Secondary | ICD-10-CM | POA: Diagnosis not present

## 2017-10-12 ENCOUNTER — Ambulatory Visit (INDEPENDENT_AMBULATORY_CARE_PROVIDER_SITE_OTHER): Payer: 59 | Admitting: *Deleted

## 2017-10-12 DIAGNOSIS — J309 Allergic rhinitis, unspecified: Secondary | ICD-10-CM | POA: Diagnosis not present

## 2017-10-15 DIAGNOSIS — M23321 Other meniscus derangements, posterior horn of medial meniscus, right knee: Secondary | ICD-10-CM | POA: Diagnosis not present

## 2017-10-19 ENCOUNTER — Ambulatory Visit (INDEPENDENT_AMBULATORY_CARE_PROVIDER_SITE_OTHER): Payer: 59 | Admitting: *Deleted

## 2017-10-19 DIAGNOSIS — J309 Allergic rhinitis, unspecified: Secondary | ICD-10-CM

## 2017-10-20 NOTE — Progress Notes (Signed)
VIALS EXP 10-22-18 

## 2017-10-21 DIAGNOSIS — S83232A Complex tear of medial meniscus, current injury, left knee, initial encounter: Secondary | ICD-10-CM | POA: Diagnosis not present

## 2017-10-21 DIAGNOSIS — G8918 Other acute postprocedural pain: Secondary | ICD-10-CM | POA: Diagnosis not present

## 2017-10-21 DIAGNOSIS — M2241 Chondromalacia patellae, right knee: Secondary | ICD-10-CM | POA: Diagnosis not present

## 2017-10-21 DIAGNOSIS — S83231A Complex tear of medial meniscus, current injury, right knee, initial encounter: Secondary | ICD-10-CM | POA: Diagnosis not present

## 2017-10-23 DIAGNOSIS — J3089 Other allergic rhinitis: Secondary | ICD-10-CM | POA: Diagnosis not present

## 2017-10-26 ENCOUNTER — Ambulatory Visit (INDEPENDENT_AMBULATORY_CARE_PROVIDER_SITE_OTHER): Payer: 59 | Admitting: *Deleted

## 2017-10-26 DIAGNOSIS — J309 Allergic rhinitis, unspecified: Secondary | ICD-10-CM | POA: Diagnosis not present

## 2017-10-27 DIAGNOSIS — S83231A Complex tear of medial meniscus, current injury, right knee, initial encounter: Secondary | ICD-10-CM | POA: Diagnosis not present

## 2017-10-27 DIAGNOSIS — Z9889 Other specified postprocedural states: Secondary | ICD-10-CM | POA: Diagnosis not present

## 2017-11-09 ENCOUNTER — Ambulatory Visit (INDEPENDENT_AMBULATORY_CARE_PROVIDER_SITE_OTHER): Payer: 59 | Admitting: *Deleted

## 2017-11-09 DIAGNOSIS — J309 Allergic rhinitis, unspecified: Secondary | ICD-10-CM | POA: Diagnosis not present

## 2017-11-23 ENCOUNTER — Ambulatory Visit (INDEPENDENT_AMBULATORY_CARE_PROVIDER_SITE_OTHER): Payer: 59 | Admitting: *Deleted

## 2017-11-23 DIAGNOSIS — J309 Allergic rhinitis, unspecified: Secondary | ICD-10-CM | POA: Diagnosis not present

## 2017-12-10 DIAGNOSIS — Z23 Encounter for immunization: Secondary | ICD-10-CM | POA: Diagnosis not present

## 2017-12-14 ENCOUNTER — Ambulatory Visit (INDEPENDENT_AMBULATORY_CARE_PROVIDER_SITE_OTHER): Payer: 59 | Admitting: *Deleted

## 2017-12-14 DIAGNOSIS — J309 Allergic rhinitis, unspecified: Secondary | ICD-10-CM | POA: Diagnosis not present

## 2017-12-22 DIAGNOSIS — M1711 Unilateral primary osteoarthritis, right knee: Secondary | ICD-10-CM | POA: Diagnosis not present

## 2017-12-22 DIAGNOSIS — Z4789 Encounter for other orthopedic aftercare: Secondary | ICD-10-CM | POA: Diagnosis not present

## 2018-02-18 DIAGNOSIS — Z9889 Other specified postprocedural states: Secondary | ICD-10-CM | POA: Diagnosis not present

## 2018-02-18 DIAGNOSIS — M1711 Unilateral primary osteoarthritis, right knee: Secondary | ICD-10-CM | POA: Diagnosis not present

## 2018-02-18 DIAGNOSIS — M25561 Pain in right knee: Secondary | ICD-10-CM | POA: Diagnosis not present

## 2018-02-24 DIAGNOSIS — M898X6 Other specified disorders of bone, lower leg: Secondary | ICD-10-CM | POA: Diagnosis not present

## 2018-02-24 DIAGNOSIS — M25561 Pain in right knee: Secondary | ICD-10-CM | POA: Diagnosis not present

## 2018-03-01 DIAGNOSIS — G2581 Restless legs syndrome: Secondary | ICD-10-CM | POA: Diagnosis not present

## 2018-03-02 ENCOUNTER — Other Ambulatory Visit: Payer: Self-pay | Admitting: Orthopedic Surgery

## 2018-03-02 DIAGNOSIS — Z9889 Other specified postprocedural states: Secondary | ICD-10-CM

## 2018-03-15 ENCOUNTER — Other Ambulatory Visit: Payer: 59

## 2018-03-15 ENCOUNTER — Ambulatory Visit
Admission: RE | Admit: 2018-03-15 | Discharge: 2018-03-15 | Disposition: A | Discharge status: Home / Self Care | Payer: 59 | Source: Ambulatory Visit | Attending: Orthopedic Surgery | Admitting: Orthopedic Surgery

## 2018-03-15 DIAGNOSIS — Z9889 Other specified postprocedural states: Secondary | ICD-10-CM

## 2018-03-15 DIAGNOSIS — M25561 Pain in right knee: Secondary | ICD-10-CM | POA: Diagnosis not present

## 2018-03-31 DIAGNOSIS — M6751 Plica syndrome, right knee: Secondary | ICD-10-CM | POA: Diagnosis not present

## 2018-03-31 DIAGNOSIS — G8918 Other acute postprocedural pain: Secondary | ICD-10-CM | POA: Diagnosis not present

## 2018-03-31 DIAGNOSIS — M659 Synovitis and tenosynovitis, unspecified: Secondary | ICD-10-CM | POA: Diagnosis not present

## 2018-03-31 DIAGNOSIS — S83231A Complex tear of medial meniscus, current injury, right knee, initial encounter: Secondary | ICD-10-CM | POA: Diagnosis not present

## 2018-04-06 DIAGNOSIS — S83231A Complex tear of medial meniscus, current injury, right knee, initial encounter: Secondary | ICD-10-CM | POA: Diagnosis not present

## 2018-04-06 DIAGNOSIS — R262 Difficulty in walking, not elsewhere classified: Secondary | ICD-10-CM | POA: Diagnosis not present

## 2018-04-06 DIAGNOSIS — M25561 Pain in right knee: Secondary | ICD-10-CM | POA: Diagnosis not present

## 2018-04-12 IMAGING — CR DG CHEST 2V
2 series · 2 of 2 positions shown · non-contrast
Comparison: None.

CLINICAL DATA: Cough, chest congestion, tightness in mid chest for
a couple of months, hx pneumonia several yrs ago, pt is a fireman

EXAM:
CHEST  2 VIEW

[w chest pa]
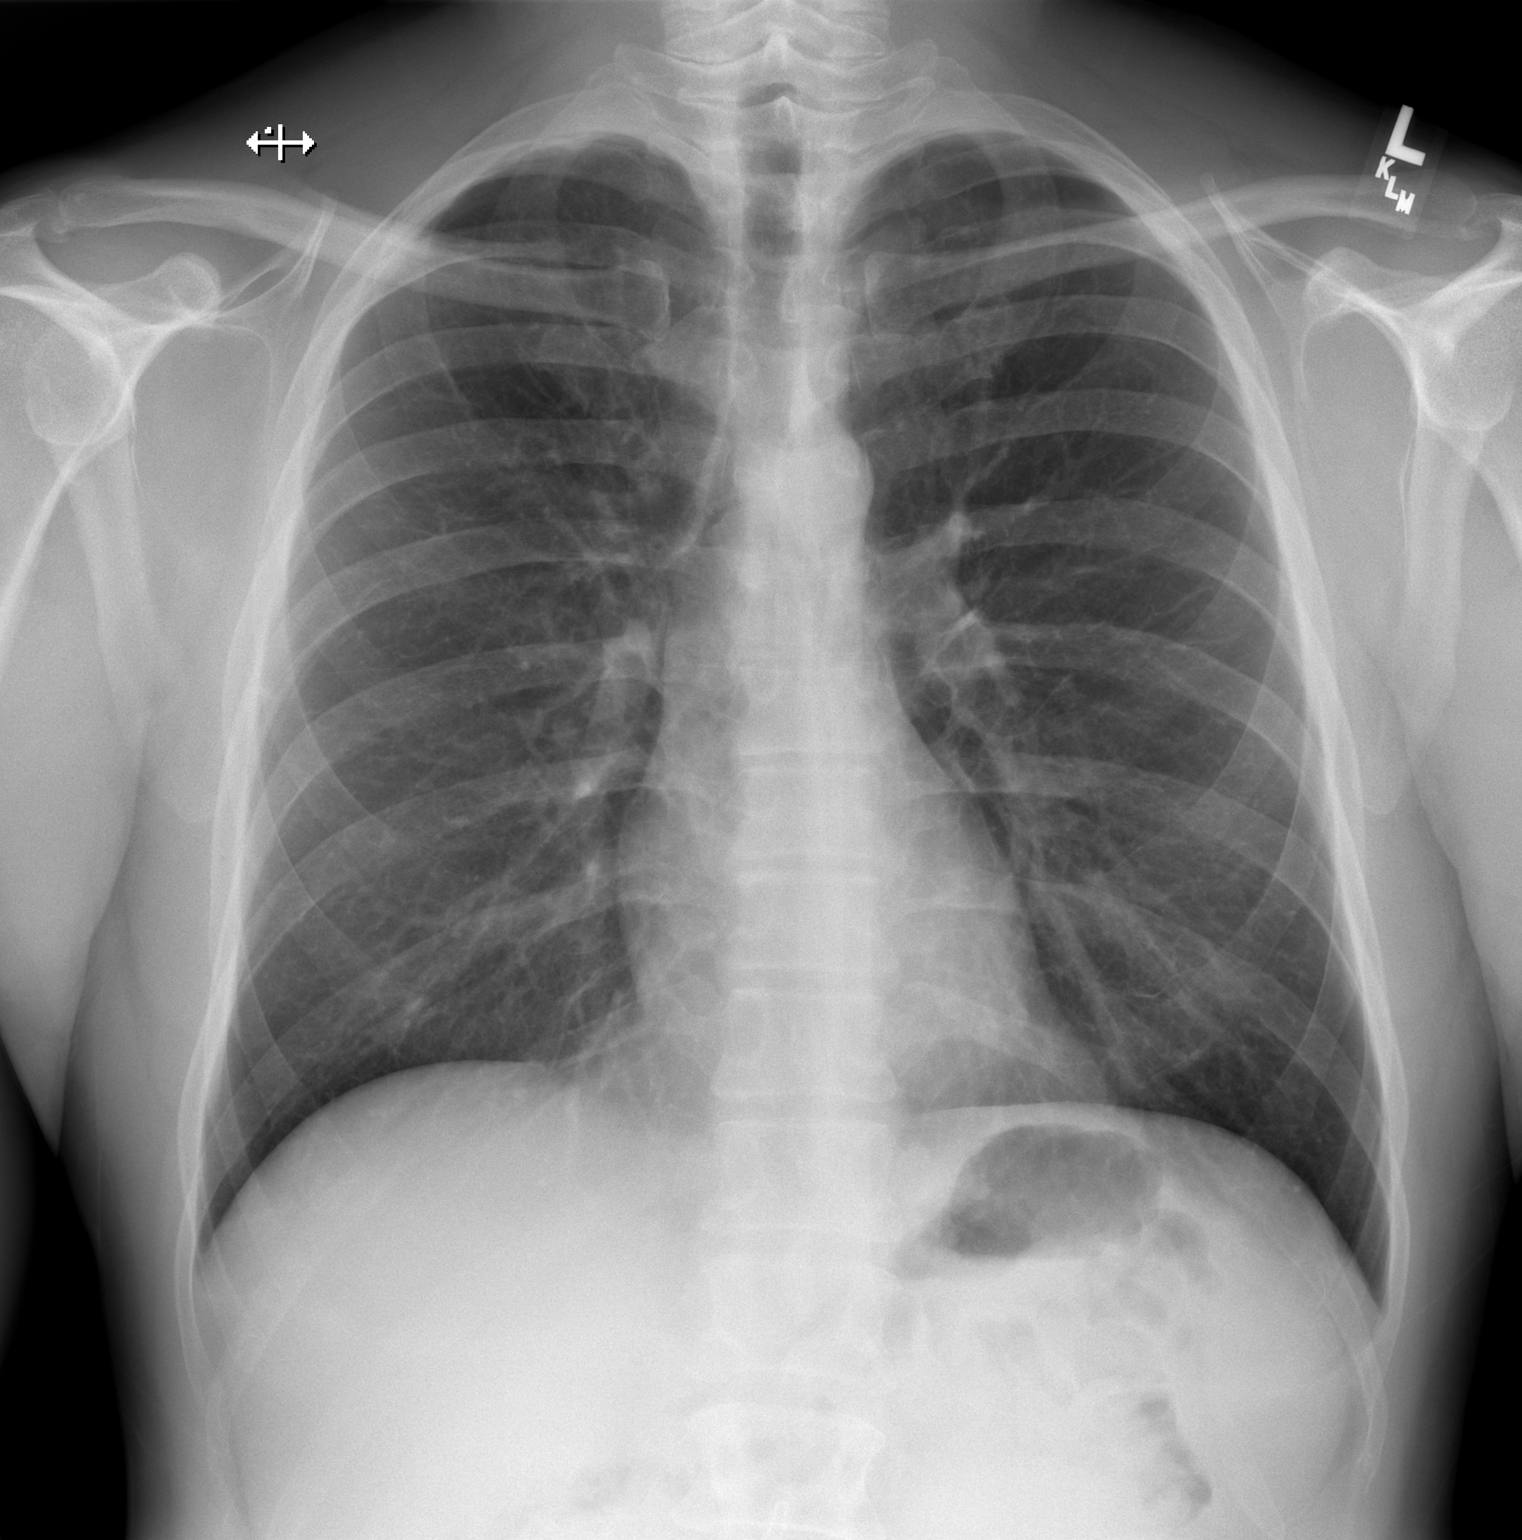

[w chest lat]
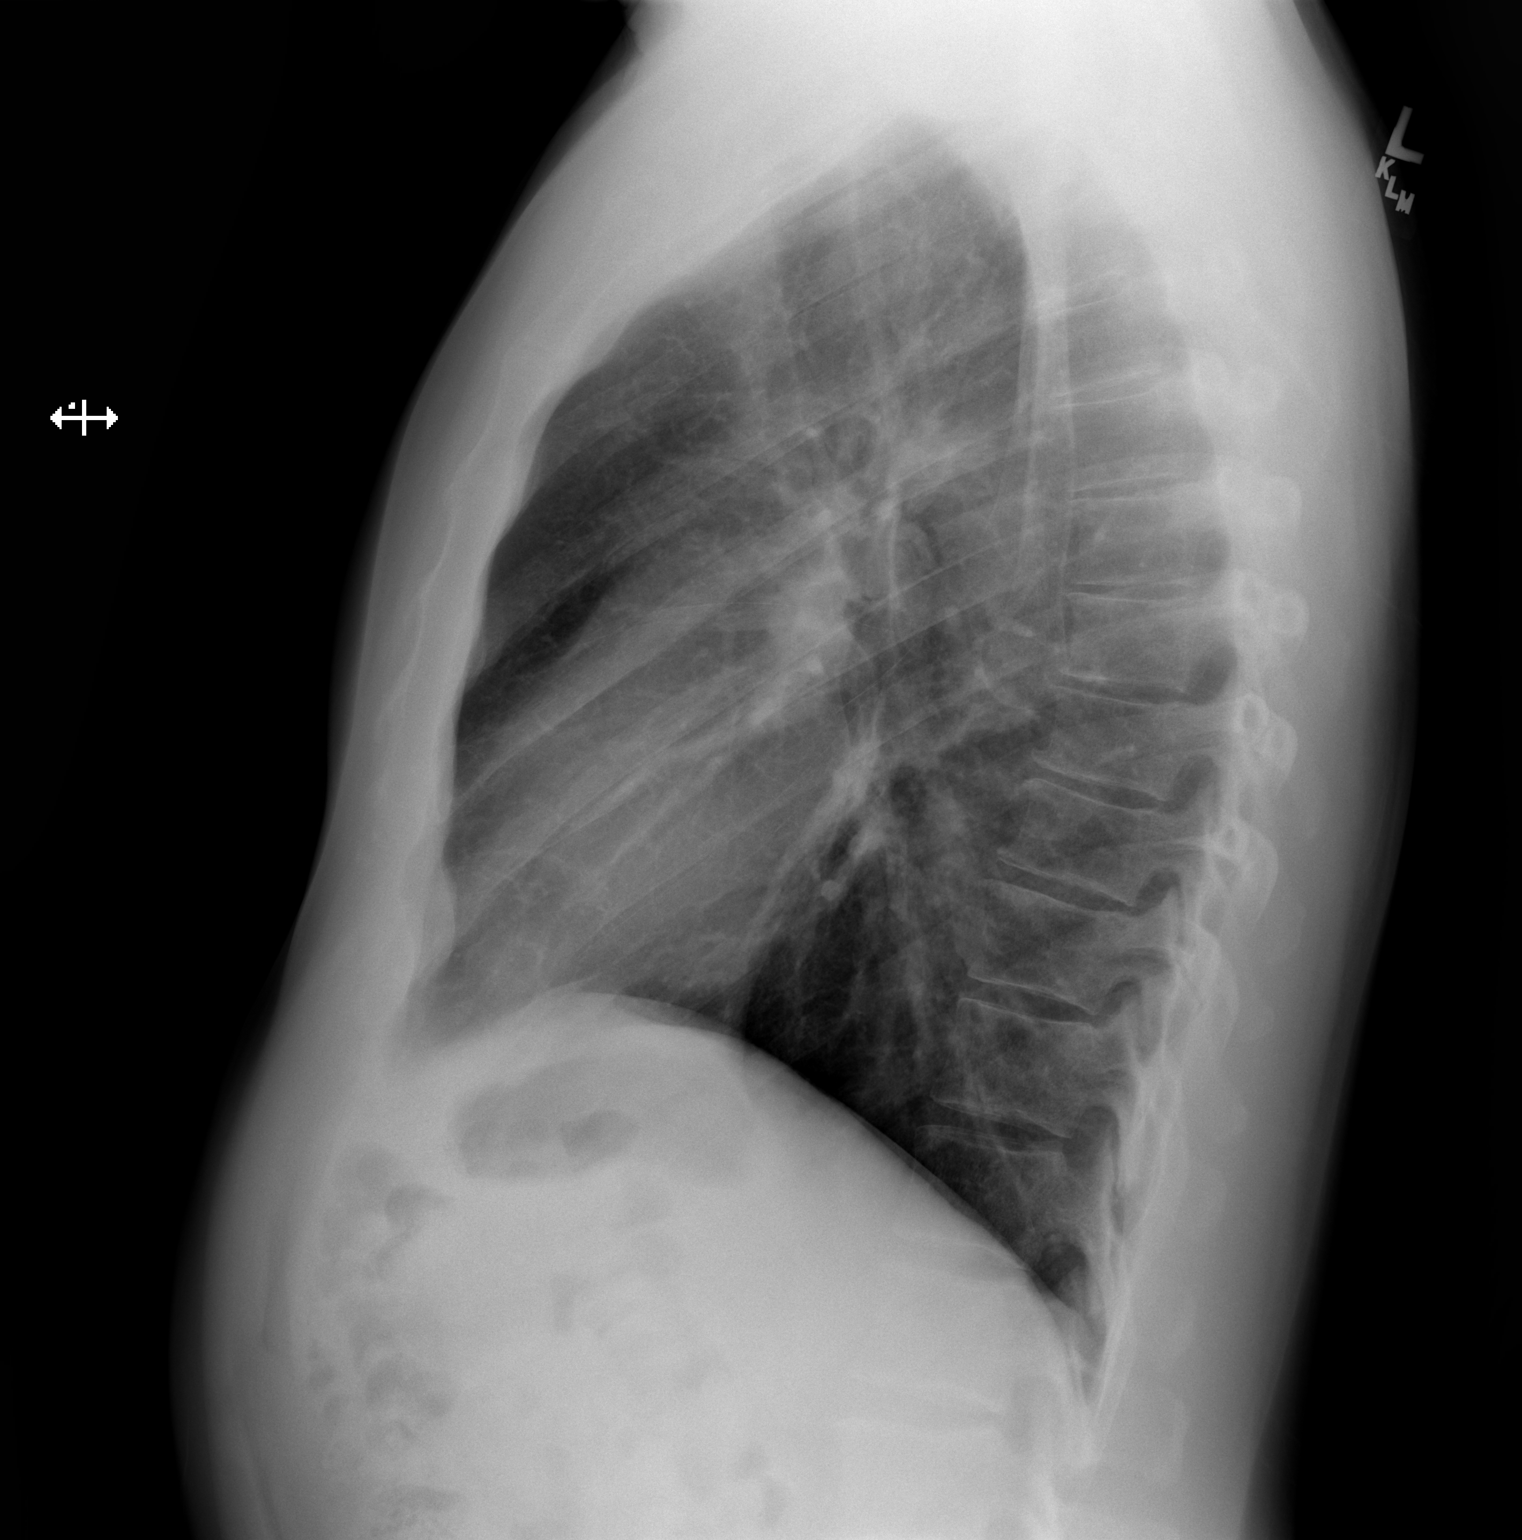

[2 of 2 positions shown; findings below may reference images not displayed]

FINDINGS: The heart size and mediastinal contours are within normal limits.
Both lungs are clear. No pleural effusion or pneumothorax. The
visualized skeletal structures are unremarkable.
IMPRESSION: No active cardiopulmonary disease.

## 2018-04-16 IMAGING — DX DG CHEST 2V
2 series · 2 of 2 positions shown · non-contrast
Comparison: 06/19/2016

CLINICAL DATA: Chest tightness for 2 weeks

EXAM:
CHEST  2 VIEW

[chest pa]
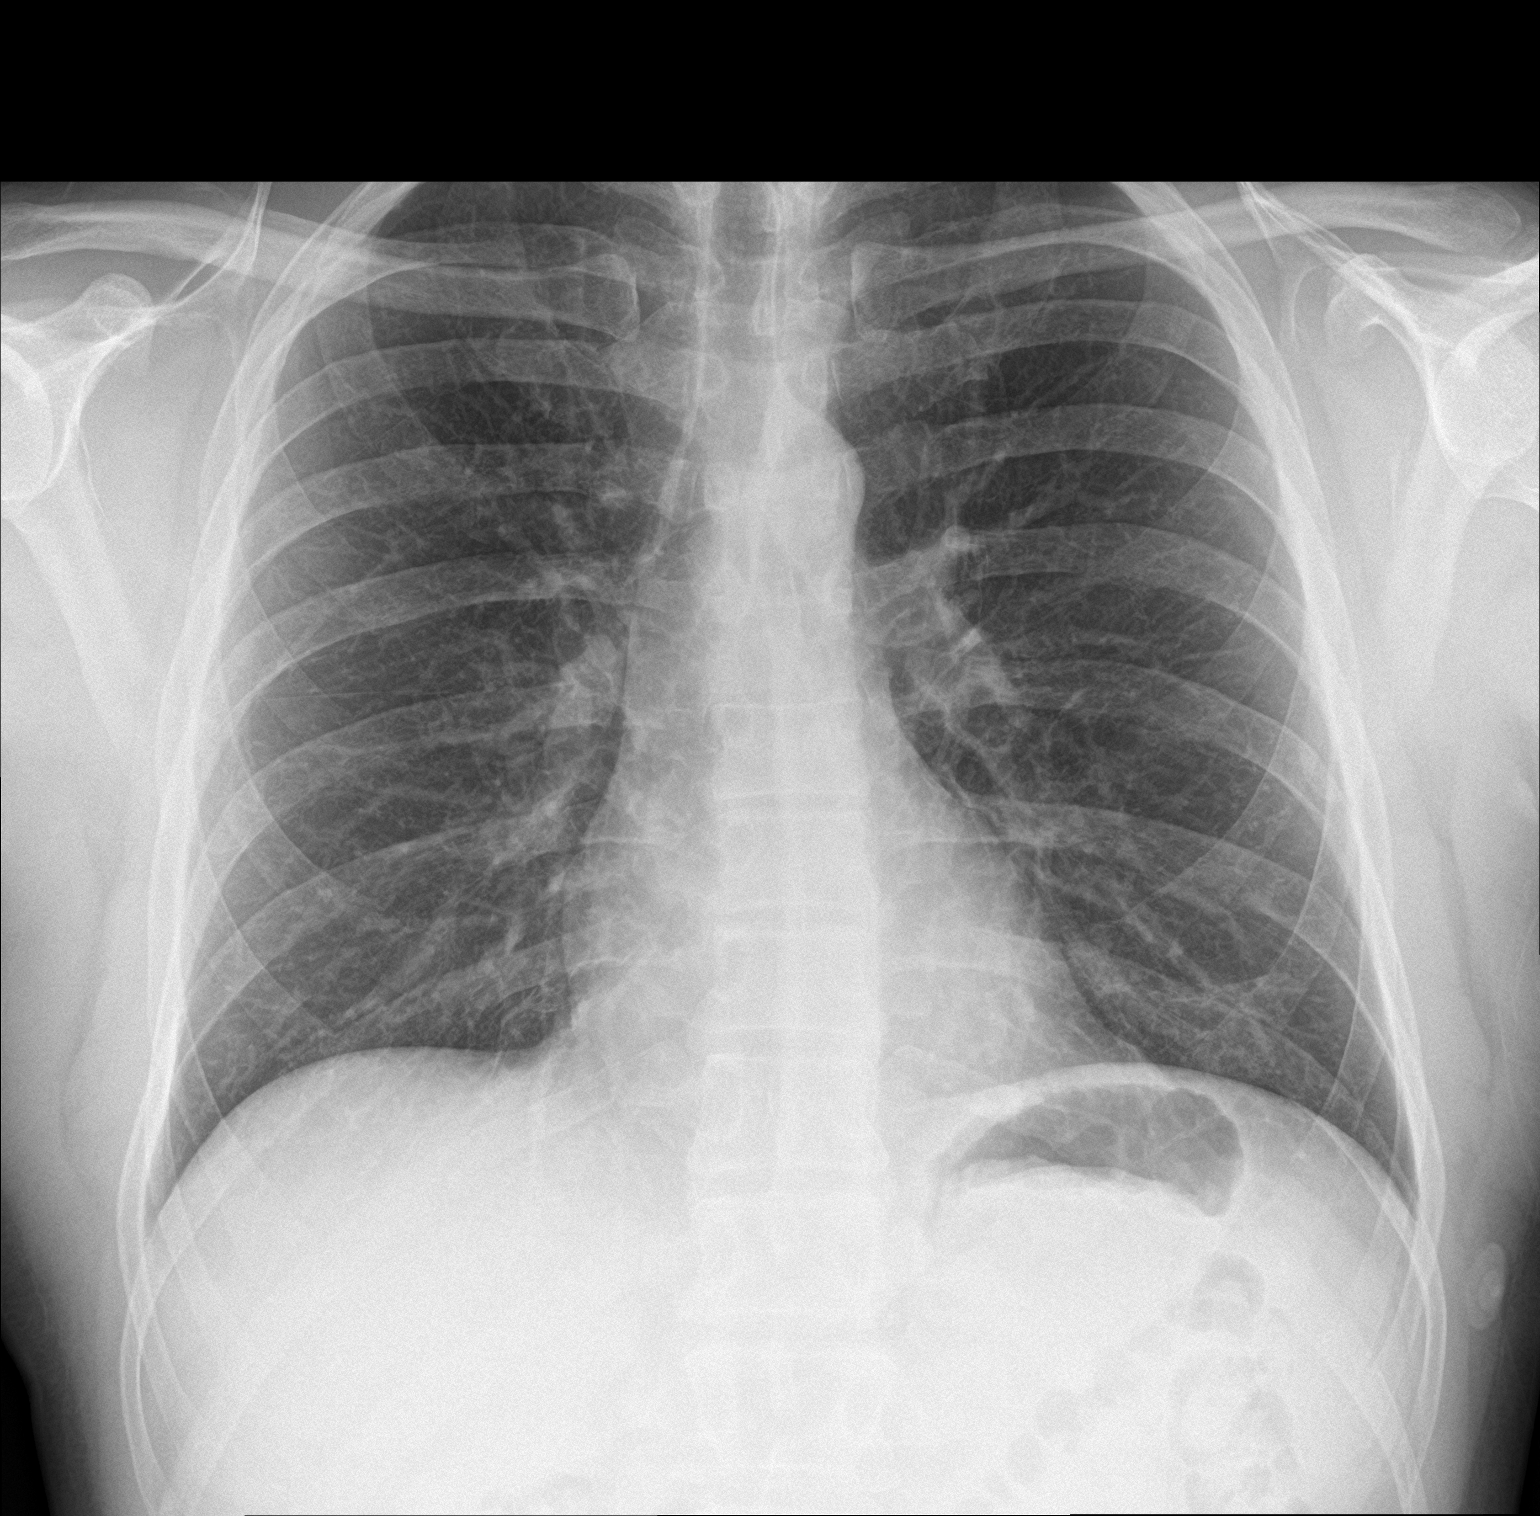

[chest lat]
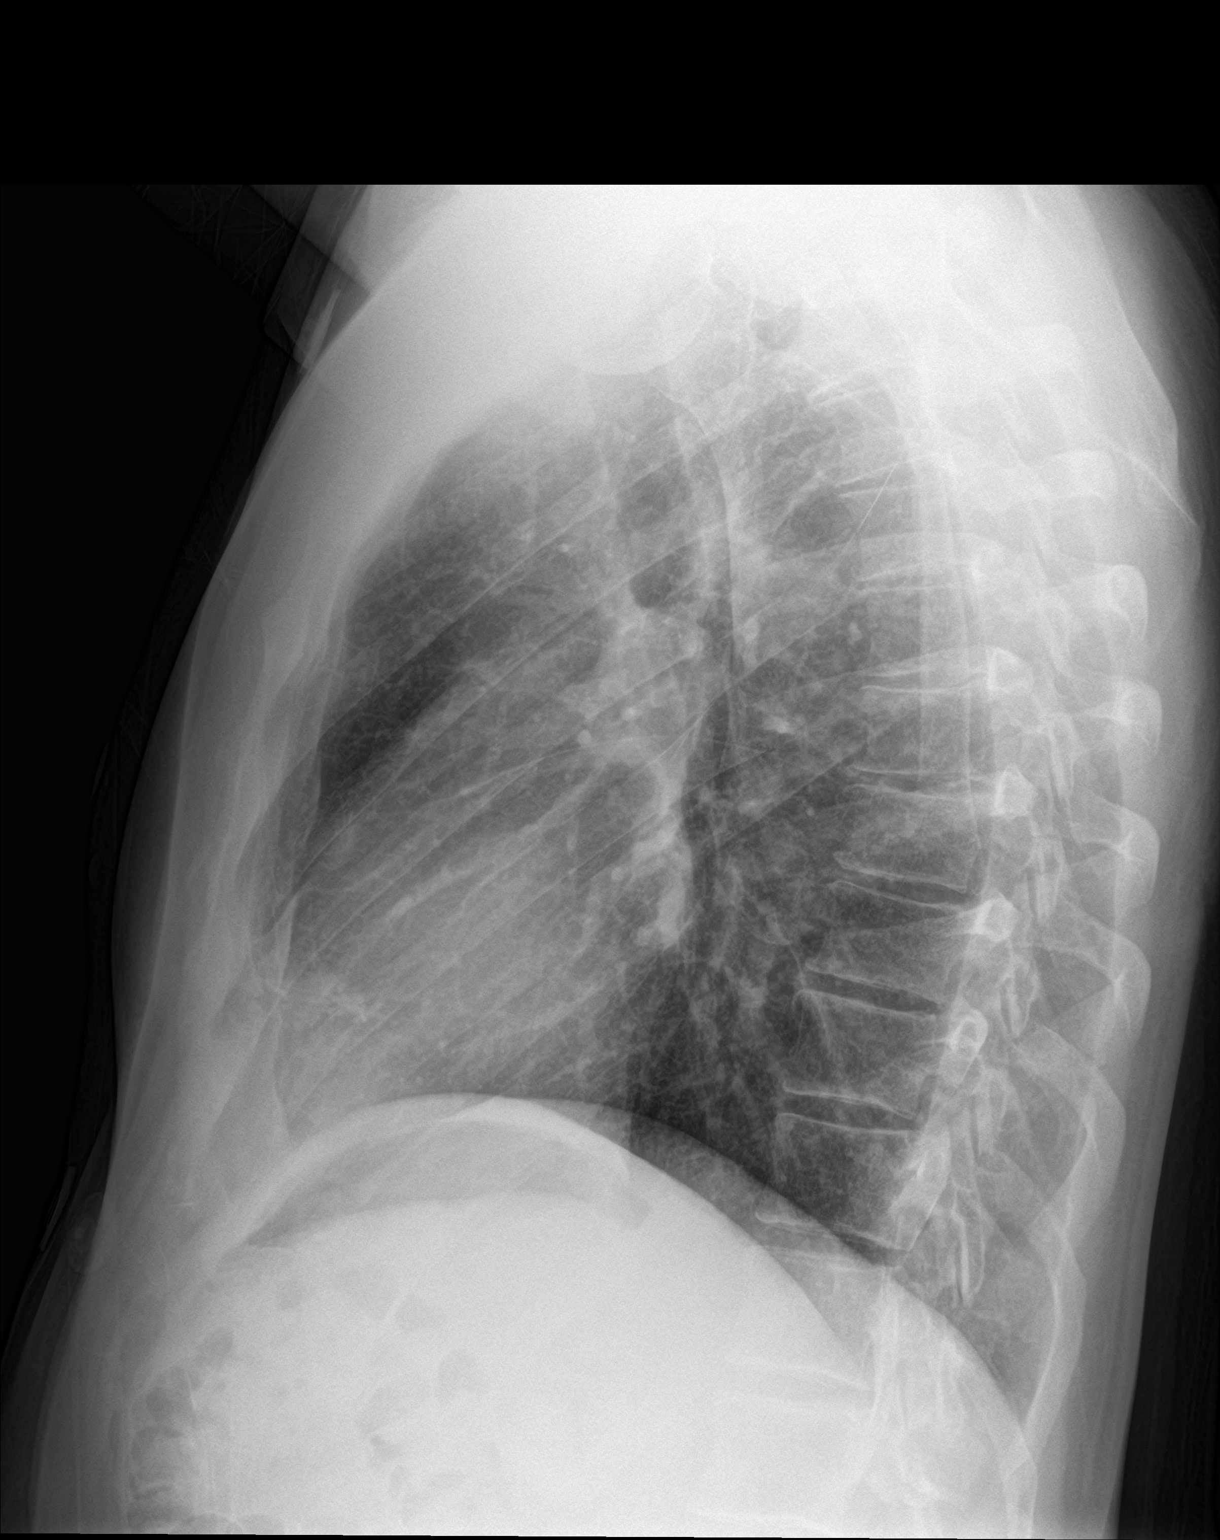

[2 of 2 positions shown; findings below may reference images not displayed]

FINDINGS: Cardiac shadow is within normal limits. The lungs are well aerated
bilaterally. No focal infiltrate the is noted. Minimal left basilar
atelectasis is seen projecting in the lingula. No acute bony
abnormality is noted.
IMPRESSION: Minimal left basilar atelectasis.

## 2018-05-12 DIAGNOSIS — H1045 Other chronic allergic conjunctivitis: Secondary | ICD-10-CM | POA: Diagnosis not present

## 2018-05-18 DIAGNOSIS — Z9889 Other specified postprocedural states: Secondary | ICD-10-CM | POA: Diagnosis not present

## 2018-06-10 DIAGNOSIS — H1045 Other chronic allergic conjunctivitis: Secondary | ICD-10-CM | POA: Diagnosis not present

## 2018-07-06 DIAGNOSIS — Z9889 Other specified postprocedural states: Secondary | ICD-10-CM | POA: Diagnosis not present

## 2018-07-06 DIAGNOSIS — G8929 Other chronic pain: Secondary | ICD-10-CM | POA: Diagnosis not present

## 2018-07-06 DIAGNOSIS — M25561 Pain in right knee: Secondary | ICD-10-CM | POA: Diagnosis not present

## 2019-08-02 IMAGING — MR MR KNEE*R* W/O CM
4 of 7 series · 19 of 40 positions shown · non-contrast
Comparison: None.

CLINICAL DATA: Chronic progressive right knee pain and swelling.

EXAM:
MRI OF THE RIGHT KNEE WITHOUT CONTRAST
TECHNIQUE: Multiplanar, multisequence MR imaging of the knee was performed. No
intravenous contrast was administered.

[Series 3: T2 fat-sat · axial · 4.0mm · 0.31mm/px · z∈[-55,+50]mm · 3 of 25 slices shown]
[im 1/25]
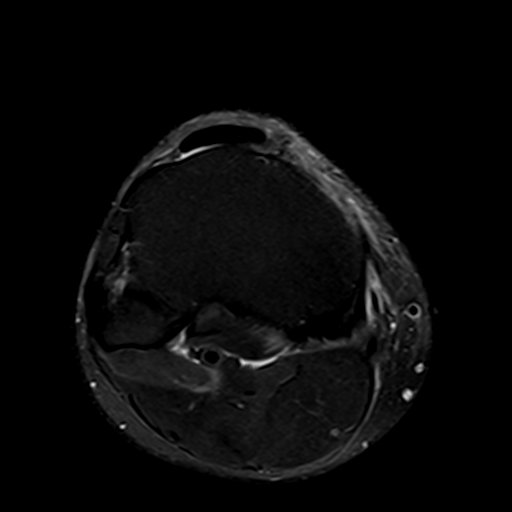
[im 13/25]
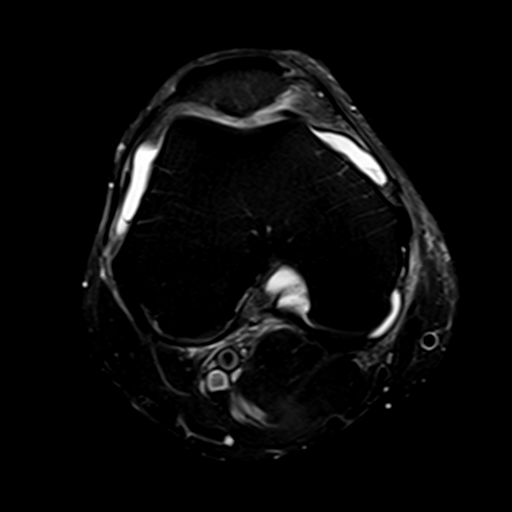
[im 25/25]
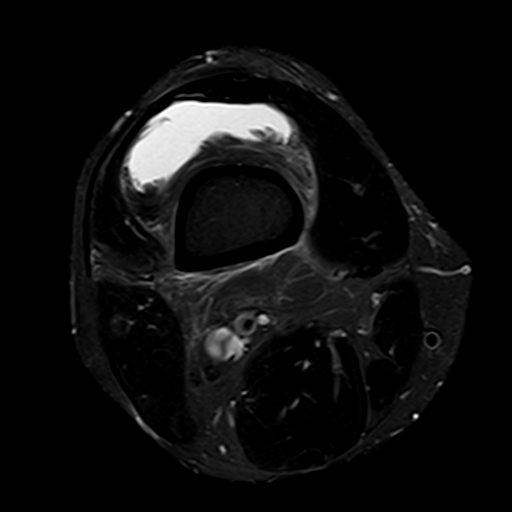

[Series 6: PD fat-sat · coronal · 3.0mm · 0.29mm/px · 7 of 28 slices shown (1 of 3)]
[im 1/28]
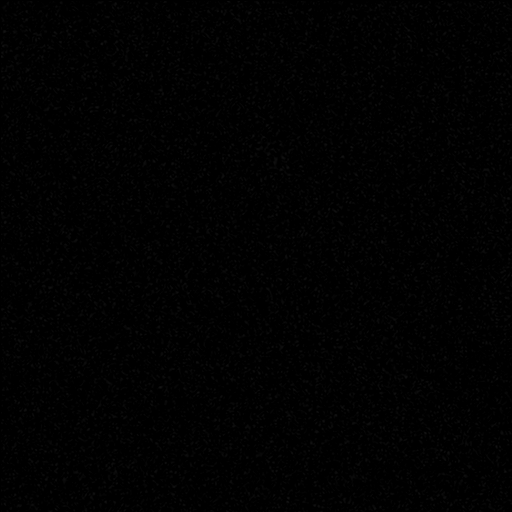
[im 5/28]
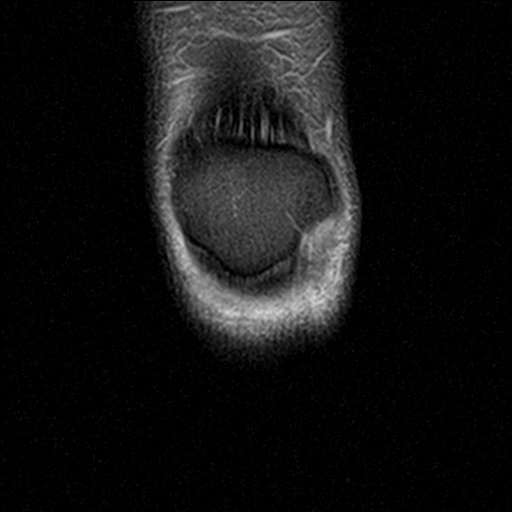
[im 10/28]
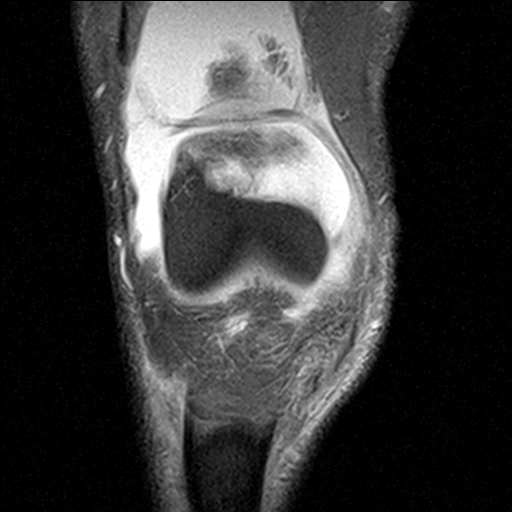
[im 14/28]
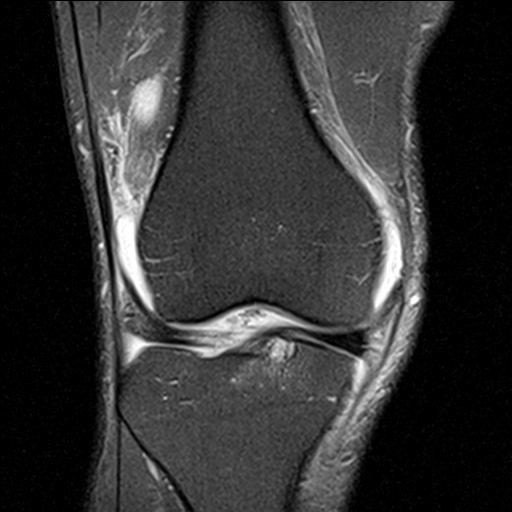
[im 19/28]
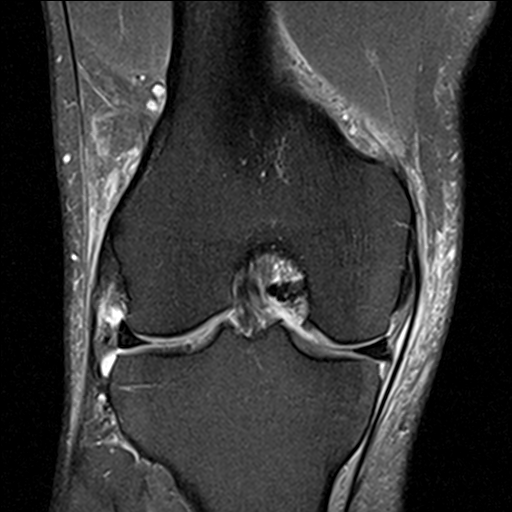
[im 23/28]
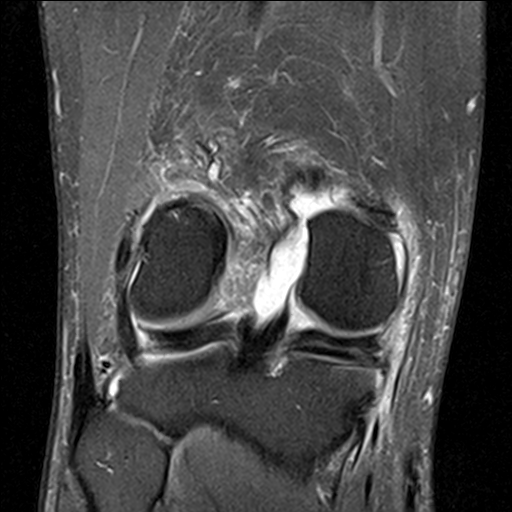
[im 28/28]
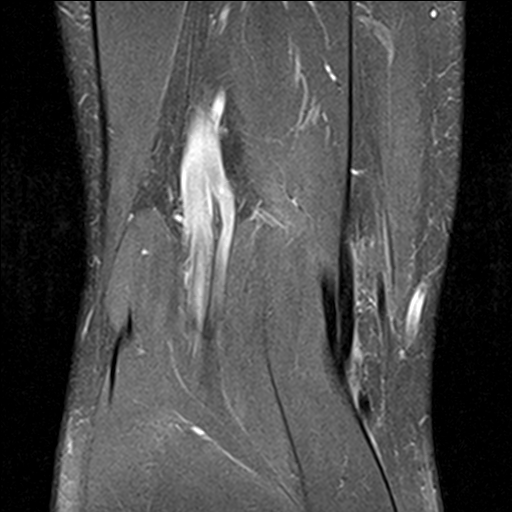

[Series 7: PD fat-sat · sagittal · 3.0mm · 0.29mm/px · 6 of 30 slices shown (2 of 3)]
[im 1/30]
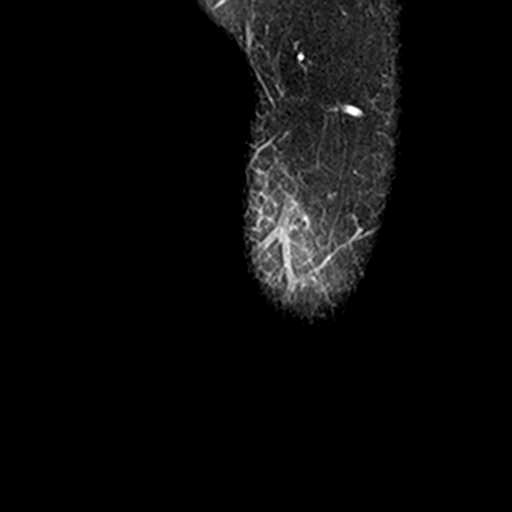
[im 5/30]
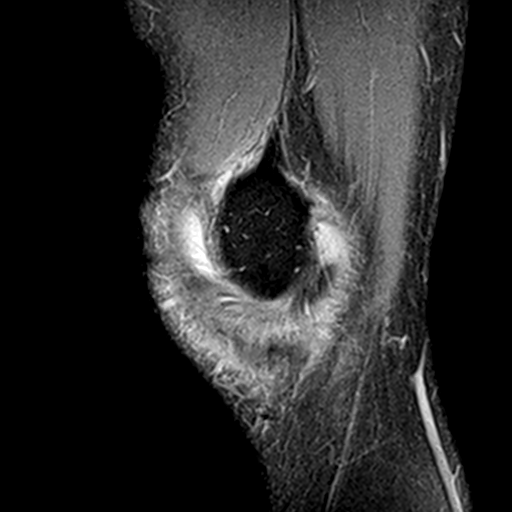
[im 10/30]
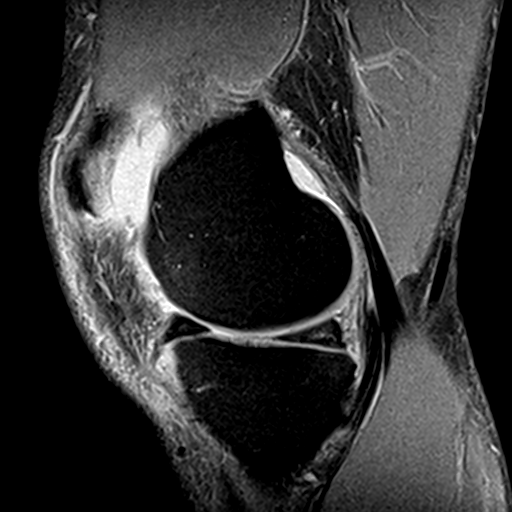
[im 15/30]
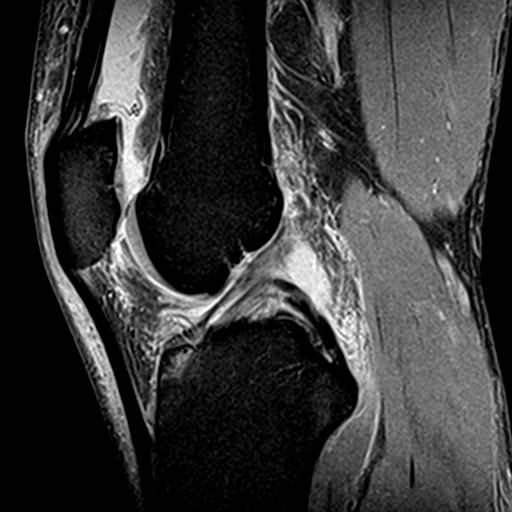
[im 20/30]
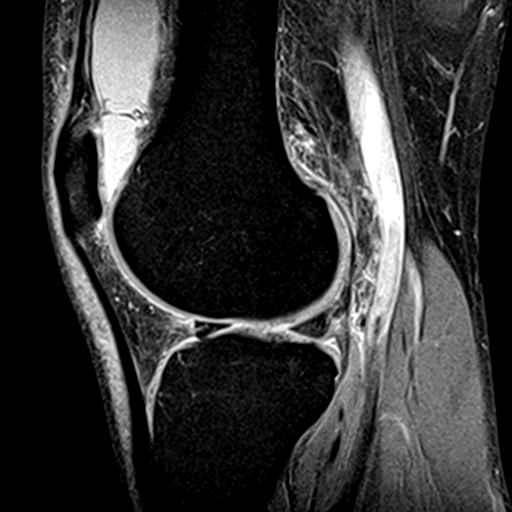
[im 25/30]
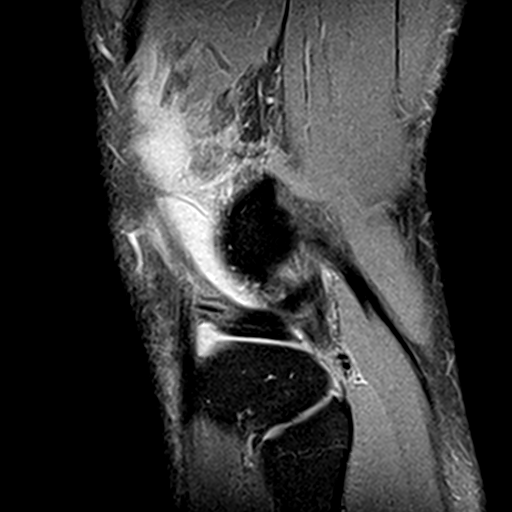

[Series 9: PD fat-sat · oblique · 2.0mm · 0.29mm/px · 3 of 11 slices shown (3 of 3)]
[im 1/11]
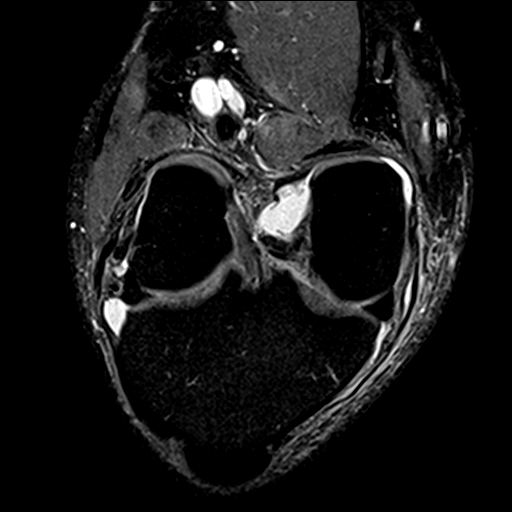
[im 6/11]
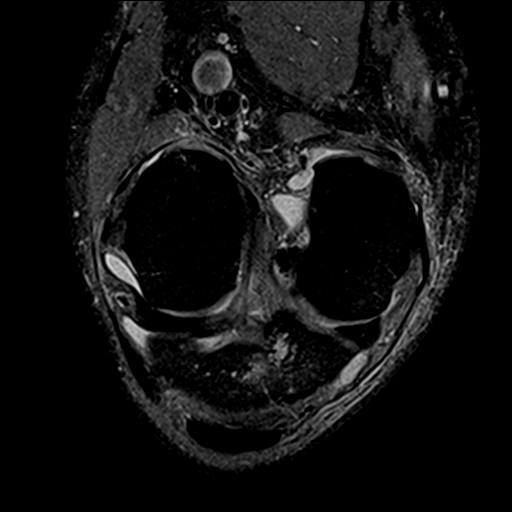
[im 11/11]
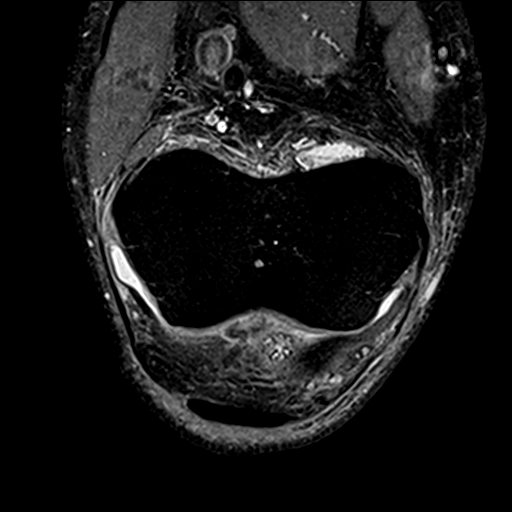

[19 of 40 positions shown; findings below may reference images not displayed]

FINDINGS: MENISCI

Medial meniscus: There is a complex undersurface horizontal tear of
the posterior horn at the posteromedial corner. A portion of the
torn meniscus is subluxed into the inferior gutter best seen on
images 7 and 8 of series 6.

Lateral meniscus:  Normal.

LIGAMENTS

Cruciates:  Normal.

Collaterals: Intact. Focal fluid deep to the MCL consistent with
medial bursitis, probably related to the underlying meniscal tear.

CARTILAGE

Patellofemoral: Small focal areas of partial-thickness cartilage
loss and fissuring on the patella, best seen on series 7.

Medial:  Normal.

Lateral: Small focal area of full-thickness cartilage loss of the
posterior central aspect of the lateral tibial plateau.

Joint:  Large joint effusion.  Small suprapatellar plica.

Popliteal Fossa:  No Baker cyst. Intact popliteus tendon.

Extensor Mechanism:  Normal.

Bones:  Normal.

Other: None
IMPRESSION: 1. Complex tear of the posterior horn of the medial meniscus at the
posteromedial corner.
2. Mild medial bursitis.
3. Large joint effusion suprapatellar plica.
4. Small areas of cartilage loss on the patella and lateral tibial
plateau as described above.

## 2020-01-17 DIAGNOSIS — M4726 Other spondylosis with radiculopathy, lumbar region: Secondary | ICD-10-CM | POA: Diagnosis not present

## 2020-01-17 DIAGNOSIS — Q7649 Other congenital malformations of spine, not associated with scoliosis: Secondary | ICD-10-CM | POA: Diagnosis not present

## 2020-01-17 DIAGNOSIS — M9905 Segmental and somatic dysfunction of pelvic region: Secondary | ICD-10-CM | POA: Diagnosis not present

## 2020-01-17 DIAGNOSIS — M9903 Segmental and somatic dysfunction of lumbar region: Secondary | ICD-10-CM | POA: Diagnosis not present

## 2020-01-19 DIAGNOSIS — Q7649 Other congenital malformations of spine, not associated with scoliosis: Secondary | ICD-10-CM | POA: Diagnosis not present

## 2020-01-19 DIAGNOSIS — M9905 Segmental and somatic dysfunction of pelvic region: Secondary | ICD-10-CM | POA: Diagnosis not present

## 2020-01-19 DIAGNOSIS — M4726 Other spondylosis with radiculopathy, lumbar region: Secondary | ICD-10-CM | POA: Diagnosis not present

## 2020-01-19 DIAGNOSIS — M9903 Segmental and somatic dysfunction of lumbar region: Secondary | ICD-10-CM | POA: Diagnosis not present

## 2020-01-24 DIAGNOSIS — M9905 Segmental and somatic dysfunction of pelvic region: Secondary | ICD-10-CM | POA: Diagnosis not present

## 2020-01-24 DIAGNOSIS — M9903 Segmental and somatic dysfunction of lumbar region: Secondary | ICD-10-CM | POA: Diagnosis not present

## 2020-01-24 DIAGNOSIS — Q7649 Other congenital malformations of spine, not associated with scoliosis: Secondary | ICD-10-CM | POA: Diagnosis not present

## 2020-01-24 DIAGNOSIS — M4726 Other spondylosis with radiculopathy, lumbar region: Secondary | ICD-10-CM | POA: Diagnosis not present

## 2020-01-26 DIAGNOSIS — M9905 Segmental and somatic dysfunction of pelvic region: Secondary | ICD-10-CM | POA: Diagnosis not present

## 2020-01-26 DIAGNOSIS — M4726 Other spondylosis with radiculopathy, lumbar region: Secondary | ICD-10-CM | POA: Diagnosis not present

## 2020-01-26 DIAGNOSIS — Q7649 Other congenital malformations of spine, not associated with scoliosis: Secondary | ICD-10-CM | POA: Diagnosis not present

## 2020-01-26 DIAGNOSIS — M9903 Segmental and somatic dysfunction of lumbar region: Secondary | ICD-10-CM | POA: Diagnosis not present

## 2020-01-31 DIAGNOSIS — Q7649 Other congenital malformations of spine, not associated with scoliosis: Secondary | ICD-10-CM | POA: Diagnosis not present

## 2020-01-31 DIAGNOSIS — M4726 Other spondylosis with radiculopathy, lumbar region: Secondary | ICD-10-CM | POA: Diagnosis not present

## 2020-01-31 DIAGNOSIS — M9905 Segmental and somatic dysfunction of pelvic region: Secondary | ICD-10-CM | POA: Diagnosis not present

## 2020-01-31 DIAGNOSIS — M9903 Segmental and somatic dysfunction of lumbar region: Secondary | ICD-10-CM | POA: Diagnosis not present

## 2020-02-02 DIAGNOSIS — M9903 Segmental and somatic dysfunction of lumbar region: Secondary | ICD-10-CM | POA: Diagnosis not present

## 2020-02-02 DIAGNOSIS — M4726 Other spondylosis with radiculopathy, lumbar region: Secondary | ICD-10-CM | POA: Diagnosis not present

## 2020-02-02 DIAGNOSIS — Q7649 Other congenital malformations of spine, not associated with scoliosis: Secondary | ICD-10-CM | POA: Diagnosis not present

## 2020-02-02 DIAGNOSIS — M9905 Segmental and somatic dysfunction of pelvic region: Secondary | ICD-10-CM | POA: Diagnosis not present

## 2020-02-07 DIAGNOSIS — M9905 Segmental and somatic dysfunction of pelvic region: Secondary | ICD-10-CM | POA: Diagnosis not present

## 2020-02-07 DIAGNOSIS — M9903 Segmental and somatic dysfunction of lumbar region: Secondary | ICD-10-CM | POA: Diagnosis not present

## 2020-02-07 DIAGNOSIS — M4726 Other spondylosis with radiculopathy, lumbar region: Secondary | ICD-10-CM | POA: Diagnosis not present

## 2020-02-07 DIAGNOSIS — Q7649 Other congenital malformations of spine, not associated with scoliosis: Secondary | ICD-10-CM | POA: Diagnosis not present

## 2020-02-14 DIAGNOSIS — M4726 Other spondylosis with radiculopathy, lumbar region: Secondary | ICD-10-CM | POA: Diagnosis not present

## 2020-02-14 DIAGNOSIS — Q7649 Other congenital malformations of spine, not associated with scoliosis: Secondary | ICD-10-CM | POA: Diagnosis not present

## 2020-02-14 DIAGNOSIS — M9903 Segmental and somatic dysfunction of lumbar region: Secondary | ICD-10-CM | POA: Diagnosis not present

## 2020-02-14 DIAGNOSIS — M9905 Segmental and somatic dysfunction of pelvic region: Secondary | ICD-10-CM | POA: Diagnosis not present

## 2020-02-28 DIAGNOSIS — M9905 Segmental and somatic dysfunction of pelvic region: Secondary | ICD-10-CM | POA: Diagnosis not present

## 2020-02-28 DIAGNOSIS — M4726 Other spondylosis with radiculopathy, lumbar region: Secondary | ICD-10-CM | POA: Diagnosis not present

## 2020-02-28 DIAGNOSIS — Q7649 Other congenital malformations of spine, not associated with scoliosis: Secondary | ICD-10-CM | POA: Diagnosis not present

## 2020-02-28 DIAGNOSIS — M9903 Segmental and somatic dysfunction of lumbar region: Secondary | ICD-10-CM | POA: Diagnosis not present

## 2020-03-20 DIAGNOSIS — M4726 Other spondylosis with radiculopathy, lumbar region: Secondary | ICD-10-CM | POA: Diagnosis not present

## 2020-03-20 DIAGNOSIS — M9903 Segmental and somatic dysfunction of lumbar region: Secondary | ICD-10-CM | POA: Diagnosis not present

## 2020-03-20 DIAGNOSIS — Q7649 Other congenital malformations of spine, not associated with scoliosis: Secondary | ICD-10-CM | POA: Diagnosis not present

## 2020-03-20 DIAGNOSIS — M9905 Segmental and somatic dysfunction of pelvic region: Secondary | ICD-10-CM | POA: Diagnosis not present

## 2020-04-11 DIAGNOSIS — R0789 Other chest pain: Secondary | ICD-10-CM | POA: Diagnosis not present

## 2020-04-11 DIAGNOSIS — K219 Gastro-esophageal reflux disease without esophagitis: Secondary | ICD-10-CM | POA: Diagnosis not present

## 2020-04-11 DIAGNOSIS — R0981 Nasal congestion: Secondary | ICD-10-CM | POA: Diagnosis not present

## 2020-04-11 DIAGNOSIS — G2581 Restless legs syndrome: Secondary | ICD-10-CM | POA: Diagnosis not present

## 2020-04-17 DIAGNOSIS — Q7649 Other congenital malformations of spine, not associated with scoliosis: Secondary | ICD-10-CM | POA: Diagnosis not present

## 2020-04-17 DIAGNOSIS — M9903 Segmental and somatic dysfunction of lumbar region: Secondary | ICD-10-CM | POA: Diagnosis not present

## 2020-04-17 DIAGNOSIS — M9905 Segmental and somatic dysfunction of pelvic region: Secondary | ICD-10-CM | POA: Diagnosis not present

## 2020-04-17 DIAGNOSIS — M4726 Other spondylosis with radiculopathy, lumbar region: Secondary | ICD-10-CM | POA: Diagnosis not present

## 2020-05-15 DIAGNOSIS — M9903 Segmental and somatic dysfunction of lumbar region: Secondary | ICD-10-CM | POA: Diagnosis not present

## 2020-05-15 DIAGNOSIS — Q7649 Other congenital malformations of spine, not associated with scoliosis: Secondary | ICD-10-CM | POA: Diagnosis not present

## 2020-05-15 DIAGNOSIS — M9905 Segmental and somatic dysfunction of pelvic region: Secondary | ICD-10-CM | POA: Diagnosis not present

## 2020-06-17 DIAGNOSIS — M7752 Other enthesopathy of left foot: Secondary | ICD-10-CM | POA: Diagnosis not present

## 2020-06-17 DIAGNOSIS — M79672 Pain in left foot: Secondary | ICD-10-CM | POA: Diagnosis not present

## 2020-06-26 DIAGNOSIS — Z03818 Encounter for observation for suspected exposure to other biological agents ruled out: Secondary | ICD-10-CM | POA: Diagnosis not present

## 2020-06-26 DIAGNOSIS — Z20822 Contact with and (suspected) exposure to covid-19: Secondary | ICD-10-CM | POA: Diagnosis not present

## 2020-07-31 DIAGNOSIS — H539 Unspecified visual disturbance: Secondary | ICD-10-CM | POA: Diagnosis not present

## 2020-07-31 DIAGNOSIS — R030 Elevated blood-pressure reading, without diagnosis of hypertension: Secondary | ICD-10-CM | POA: Diagnosis not present

## 2020-07-31 DIAGNOSIS — R079 Chest pain, unspecified: Secondary | ICD-10-CM | POA: Diagnosis not present

## 2020-08-01 DIAGNOSIS — K219 Gastro-esophageal reflux disease without esophagitis: Secondary | ICD-10-CM | POA: Diagnosis not present

## 2020-08-01 DIAGNOSIS — J309 Allergic rhinitis, unspecified: Secondary | ICD-10-CM | POA: Diagnosis not present

## 2020-08-01 DIAGNOSIS — G2581 Restless legs syndrome: Secondary | ICD-10-CM | POA: Diagnosis not present

## 2020-08-01 DIAGNOSIS — F419 Anxiety disorder, unspecified: Secondary | ICD-10-CM | POA: Diagnosis not present

## 2020-08-22 DIAGNOSIS — Z0000 Encounter for general adult medical examination without abnormal findings: Secondary | ICD-10-CM | POA: Diagnosis not present

## 2020-08-22 DIAGNOSIS — F419 Anxiety disorder, unspecified: Secondary | ICD-10-CM | POA: Diagnosis not present

## 2020-08-22 DIAGNOSIS — G629 Polyneuropathy, unspecified: Secondary | ICD-10-CM | POA: Diagnosis not present

## 2020-08-22 DIAGNOSIS — G4700 Insomnia, unspecified: Secondary | ICD-10-CM | POA: Diagnosis not present

## 2020-08-22 DIAGNOSIS — E785 Hyperlipidemia, unspecified: Secondary | ICD-10-CM | POA: Diagnosis not present

## 2020-08-22 DIAGNOSIS — G2581 Restless legs syndrome: Secondary | ICD-10-CM | POA: Diagnosis not present

## 2020-09-19 ENCOUNTER — Encounter: Payer: Self-pay | Admitting: Family Medicine

## 2021-02-21 DIAGNOSIS — H65111 Acute and subacute allergic otitis media (mucoid) (sanguinous) (serous), right ear: Secondary | ICD-10-CM | POA: Diagnosis not present

## 2021-02-21 DIAGNOSIS — G4700 Insomnia, unspecified: Secondary | ICD-10-CM | POA: Diagnosis not present

## 2021-02-21 DIAGNOSIS — R6882 Decreased libido: Secondary | ICD-10-CM | POA: Diagnosis not present

## 2021-02-21 DIAGNOSIS — F419 Anxiety disorder, unspecified: Secondary | ICD-10-CM | POA: Diagnosis not present

## 2021-09-10 DIAGNOSIS — R5383 Other fatigue: Secondary | ICD-10-CM | POA: Diagnosis not present

## 2021-09-10 DIAGNOSIS — Z0000 Encounter for general adult medical examination without abnormal findings: Secondary | ICD-10-CM | POA: Diagnosis not present

## 2021-09-10 DIAGNOSIS — Z125 Encounter for screening for malignant neoplasm of prostate: Secondary | ICD-10-CM | POA: Diagnosis not present

## 2021-09-10 DIAGNOSIS — G2581 Restless legs syndrome: Secondary | ICD-10-CM | POA: Diagnosis not present

## 2021-09-10 DIAGNOSIS — Z13220 Encounter for screening for lipoid disorders: Secondary | ICD-10-CM | POA: Diagnosis not present

## 2021-09-10 DIAGNOSIS — F419 Anxiety disorder, unspecified: Secondary | ICD-10-CM | POA: Diagnosis not present

## 2024-02-06 ENCOUNTER — Ambulatory Visit (HOSPITAL_BASED_OUTPATIENT_CLINIC_OR_DEPARTMENT_OTHER)
Admission: EM | Admit: 2024-02-06 | Discharge: 2024-02-06 | Disposition: A | Discharge status: Home / Self Care | Attending: Family Medicine | Admitting: Family Medicine

## 2024-02-06 ENCOUNTER — Encounter (HOSPITAL_BASED_OUTPATIENT_CLINIC_OR_DEPARTMENT_OTHER): Payer: Self-pay

## 2024-02-06 DIAGNOSIS — R051 Acute cough: Secondary | ICD-10-CM

## 2024-02-06 LAB — POC COVID19/FLU A&B COMBO
Covid Antigen, POC: NEGATIVE
Influenza A Antigen, POC: NEGATIVE
Influenza B Antigen, POC: NEGATIVE

## 2024-02-06 MED ORDER — AZITHROMYCIN 250 MG PO TABS
250.0000 mg | ORAL_TABLET | Freq: Every day | ORAL | 0 refills | Status: AC
Start: 2024-02-06 — End: ?

## 2024-02-06 NOTE — ED Triage Notes (Signed)
 Pt c/o cough-productive, nasal drainage, feels like his chest is burning, HA, and slight body aches for the last 3 days. Pt has taken goodys powder yesterday for the HA with relief.

## 2024-02-06 NOTE — ED Provider Notes (Signed)
 [facility]    CSN: [id number] Arrival date & time: 02/06/24  0802      History   Chief Complaint Chief Complaint  Patient presents with   Cough    HPI [name] is a 58 y.o. male.   Pt c/o cough-productive, nasal drainage, feels like his chest is burning, HA, and slight body aches for the last 3 days. Pt has taken goodys powder yesterday for the HA with relief. Hx of URI. No fever.     Cough   Past Medical History:  Diagnosis Date   Allergic rhinoconjunctivitis    Anxiety    Restless leg     There are no active problems to display for this patient.   Past Surgical History:  Procedure Laterality Date   ADENOIDECTOMY     MEDIAL PARTIAL KNEE REPLACEMENT Bilateral    TONSILLECTOMY         Home Medications    Prior to Admission medications   Medication Sig Start Date End Date Taking? Authorizing Provider  azithromycin  (ZITHROMAX ) 250 MG tablet Take 1 tablet (250 mg total) by mouth daily. Take first 2 tablets together, then 1 every day until finished. 02/06/24  Yes Simranjit Thayer A, FNP  escitalopram (LEXAPRO) 10 MG tablet Take 10 mg by mouth daily.    [provider]  rOPINIRole (REQUIP) 1 MG tablet Take 1 mg by mouth at bedtime.    [provider]    Family History Family History  Problem Relation Age of Onset   Lung disease Father    Alcoholism Paternal Grandfather     Social History Social History   Tobacco Use   Smoking status: Some Days    Current packs/day: 0.25    Average packs/day: 0.3 packs/day for 4.0 years (1.0 ttl pk-yrs)    Types: Cigars, Cigarettes   Smokeless tobacco: Never  Vaping Use   Vaping status: Never Used  Substance Use Topics   Alcohol use: Yes    Comment: socially   Drug use: No     Allergies   Patient has no known allergies.   Review of Systems Review of Systems  Respiratory:  Positive for cough.      Physical Exam Triage Vital Signs ED Triage Vitals  Encounter  Vitals Group     BP 02/06/24 0816 117/77     Girls Systolic BP Percentile --      Girls Diastolic BP Percentile --      Boys Systolic BP Percentile --      Boys Diastolic BP Percentile --      Pulse Rate 02/06/24 0816 64     Resp 02/06/24 0816 20     Temp 02/06/24 0816 98.3 F (36.8 C)     Temp Source 02/06/24 0816 Oral     SpO2 02/06/24 0816 97 %     Weight --      Height --      Head Circumference --      Peak Flow --      Pain Score 02/06/24 0814 3     Pain Loc --      Pain Education --      Exclude from Growth Chart --    No data found.  Updated Vital Signs BP 117/77 (BP Location: Right Arm)   Pulse 64   Temp 98.3 F (36.8 C) (Oral)   Resp 20   SpO2 97%   Visual Acuity Right Eye Distance:   Left Eye Distance:  Bilateral Distance:    Right Eye Near:   Left Eye Near:    Bilateral Near:     Physical Exam Constitutional:      General: He is not in acute distress.    Appearance: Normal appearance. He is not ill-appearing, toxic-appearing or diaphoretic.  HENT:     Right Ear: Tympanic membrane, ear canal and external ear normal.     Left Ear: Tympanic membrane, ear canal and external ear normal.     Mouth/Throat:     Pharynx: Oropharynx is clear.  Eyes:     Conjunctiva/sclera: Conjunctivae normal.  Cardiovascular:     Rate and Rhythm: Normal rate and regular rhythm.     Pulses: Normal pulses.     Heart sounds: Normal heart sounds.  Pulmonary:     Effort: Pulmonary effort is normal.     Breath sounds: Normal breath sounds.  Musculoskeletal:        General: Normal range of motion.  Skin:    General: Skin is warm and dry.  Neurological:     Mental Status: He is alert.  Psychiatric:        Mood and Affect: Mood normal.      UC Treatments / Results  Labs (all labs ordered are listed, but only abnormal results are displayed) Labs Reviewed  POC COVID19/FLU A&B COMBO - Normal    EKG   Radiology No results found.  Procedures Procedures  (including critical care time)  Medications Ordered in UC Medications - No data to display  Initial Impression / Assessment and Plan / UC Course  I have reviewed the triage vital signs and the nursing notes.  Pertinent labs & imaging results that were available during my care of the patient were reviewed by me and considered in my medical decision making (see chart for details).    Take the antibiotics as prescribed for respiratory infection.  Recommend over-the-counter medications for symptoms as needed.  Follow-up as needed Acute cough-  Final Clinical Impressions(s) / UC Diagnoses   Final diagnoses:  Acute cough     Discharge Instructions      Take the antibiotics as prescribed for respiratory infection.  Recommend over-the-counter medications for symptoms as needed.  Follow-up as needed     ED Prescriptions     Medication Sig Dispense Auth. Provider   azithromycin  (ZITHROMAX ) 250 MG tablet Take 1 tablet (250 mg total) by mouth daily. Take first 2 tablets together, then 1 every day until finished. 6 tablet Adah Wilbert LABOR, FNP      PDMP not reviewed this encounter.   Adah Wilbert LABOR, FNP 02/06/24 1127

## 2024-02-06 NOTE — Discharge Instructions (Addendum)
 Take the antibiotics as prescribed for respiratory infection.  Recommend over-the-counter medications for symptoms as needed.  Follow-up as needed
